# Patient Record
Sex: Male | Born: 1990 | Race: White | Hispanic: No | Marital: Single | Smoking: Current every day smoker
Health system: Southern US, Community
[De-identification: ages and names within clinical notes are randomized; demographics above are authoritative.]

## PROBLEM LIST (undated history)

## (undated) DIAGNOSIS — E7800 Pure hypercholesterolemia, unspecified: Secondary | ICD-10-CM

## (undated) DIAGNOSIS — R040 Epistaxis: Secondary | ICD-10-CM

## (undated) DIAGNOSIS — K219 Gastro-esophageal reflux disease without esophagitis: Secondary | ICD-10-CM

## (undated) HISTORY — DX: Pure hypercholesterolemia, unspecified: E78.00

## (undated) HISTORY — DX: Gastro-esophageal reflux disease without esophagitis: K21.9

---
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
## 2014-03-04 ENCOUNTER — Encounter (HOSPITAL_COMMUNITY): Payer: Self-pay | Admitting: Emergency Medicine

## 2014-03-04 ENCOUNTER — Emergency Department (HOSPITAL_COMMUNITY): Payer: Self-pay

## 2014-03-04 ENCOUNTER — Emergency Department (HOSPITAL_COMMUNITY)
Admission: EM | Admit: 2014-03-04 | Discharge: 2014-03-04 | Disposition: A | Discharge status: Home / Self Care | Payer: Self-pay | Attending: Emergency Medicine | Admitting: Emergency Medicine

## 2014-03-04 DIAGNOSIS — Y9289 Other specified places as the place of occurrence of the external cause: Secondary | ICD-10-CM | POA: Insufficient documentation

## 2014-03-04 DIAGNOSIS — S199XXA Unspecified injury of neck, initial encounter: Secondary | ICD-10-CM

## 2014-03-04 DIAGNOSIS — Y9389 Activity, other specified: Secondary | ICD-10-CM | POA: Insufficient documentation

## 2014-03-04 DIAGNOSIS — R040 Epistaxis: Secondary | ICD-10-CM | POA: Insufficient documentation

## 2014-03-04 DIAGNOSIS — S1093XA Contusion of unspecified part of neck, initial encounter: Principal | ICD-10-CM

## 2014-03-04 DIAGNOSIS — F17200 Nicotine dependence, unspecified, uncomplicated: Secondary | ICD-10-CM | POA: Insufficient documentation

## 2014-03-04 DIAGNOSIS — W108XXA Fall (on) (from) other stairs and steps, initial encounter: Secondary | ICD-10-CM | POA: Insufficient documentation

## 2014-03-04 DIAGNOSIS — S0993XA Unspecified injury of face, initial encounter: Secondary | ICD-10-CM | POA: Insufficient documentation

## 2014-03-04 DIAGNOSIS — S0003XA Contusion of scalp, initial encounter: Secondary | ICD-10-CM | POA: Insufficient documentation

## 2014-03-04 DIAGNOSIS — S0033XA Contusion of nose, initial encounter: Secondary | ICD-10-CM

## 2014-03-04 DIAGNOSIS — S0083XA Contusion of other part of head, initial encounter: Principal | ICD-10-CM | POA: Insufficient documentation

## 2014-03-04 NOTE — ED Notes (Signed)
Presents post fall on Saturday, fell down a flight of stairs after tripping, denies LOC, injured nose and had bleeding. Today began having bleeding from left nostril began one hour ago, controlled at this time. Bruising noted to bridge of nose. Pt denies neck pain. C/o dizziness. Denies nausea. Reports constant headache.

## 2014-03-04 NOTE — ED Provider Notes (Signed)
CSN: [id number]     Arrival date & time 03/04/14  0230 History   First MD Initiated Contact with Patient 03/04/14 0541     Chief Complaint  Patient presents with  . Fall     (Consider location/radiation/quality/duration/timing/severity/associated sxs/prior Treatment) Patient is a 23 y.o. male presenting with fall. The history is provided by the patient.  Fall  He fell down some stairs 2 days ago. He did not have loss of consciousness but did hit his face. He will actually been doing reasonably well until last night when he had 2 episodes of nosebleeds. Bleeding has stopped her bleeding has been from the left nostril. He has developed a headache which he did not have initially. He denies nausea or vomiting. He denies weakness or incoordination.  History reviewed. No pertinent past medical history. History reviewed. No pertinent past surgical history. History reviewed. No pertinent family history. History  Substance Use Topics  . Smoking status: Current Every Day Smoker    Types: Cigarettes  . Smokeless tobacco: Not on file  . Alcohol Use: Yes    Review of Systems  All other systems reviewed and are negative.     Allergies  Review of patient's allergies indicates no known allergies.  Home Medications   Prior to Admission medications   Not on File   BP 145/89  Pulse 70  Temp(Src) 98.3 F (36.8 C) (Oral)  Resp 20  Ht 6' (1.829 m)  Wt 207 lb (93.895 kg)  BMI 28.07 kg/m2  SpO2 97% Physical Exam  Nursing note and vitals reviewed.  23 year old male, resting comfortably and in no acute distress. Vital signs are significant for borderline hypertension her blood pressure 145/89. Oxygen saturation is 97%, which is normal. Head is normocephalic. PERRLA, EOMI. Oropharynx is clear. there is moderate swelling of the bridge of the nose with tenderness but no crepitus. Examination the nasal cavity shows no evidence of recent bleeding. Septum is in the midline without hematoma.  Periorbital ecchymoses are present which are more prominent on the right. Neck is nontender and supple without adenopathy or JVD. Back is nontender and there is no CVA tenderness. Lungs are clear without rales, wheezes, or rhonchi. Chest is nontender. Heart has regular rate and rhythm without murmur. Abdomen is soft, flat, nontender without masses or hepatosplenomegaly and peristalsis is normoactive. Extremities have no cyanosis or edema, full range of motion is present. Skin is warm and dry without rash. Neurologic: Mental status is normal, cranial nerves are intact, there are no motor or sensory deficits.  ED Course  Procedures (including critical care time) Imaging Review Dg Facial Bones Complete  03/04/2014   CLINICAL DATA:  Fall  EXAM: FACIAL BONES COMPLETE 3+V  COMPARISON:  None.  FINDINGS: There is no evidence of fracture or other significant bone abnormality. No orbital emphysema or sinus air-fluid levels are seen.  IMPRESSION: Negative.   Electronically Signed   By: Rise Mu M.D.   On: 03/04/2014 06:25   Dg Nasal Bones  03/04/2014   CLINICAL DATA:  Fall with nasal bridge pain.  EXAM: NASAL BONES - 3+ VIEW  COMPARISON:  Face CT 08/18/2007.  FINDINGS: Transverse lucencies through the nasal arch are chronic when correlated with previous imaging. No acute fracture identified. No gross opacification of the paranasal sinuses. The nasal septum appears midline.  IMPRESSION: Remote nasal arch fractures.  No acute fracture identified.   Electronically Signed   By: Tiburcio Pea M.D.   On: 03/04/2014 06:28  MDM   Final diagnoses:  Fall down stairs, initial encounter  Contusion, nose, initial encounter  Left-sided epistaxis    Fall with facial injury. I've advised him that CT would be a preferred test to evaluate his facial injuries but he states that he does not wish to have a CT in that he is worried about cost. I explained to him that plain x-rays can give some  information but are not nearly as good as CT but he wishes to go that route to try to save some money understanding that diagnostic accuracy is circumcised there is no indication for any emergent treatment of his nosebleed since there is no active bleeding and no obvious site of his bleeding. He is in for her x-rays of facial bones and nasal bones.  X-rays are negative for fractures. Patient is reassured on these findings and he is discharged with instructions on how to manage any future nosebleeds. He is given a work release for 24 hours.  Dione Boozeavid Deaveon Schoen, MD 03/04/14 [phone]

## 2014-03-04 NOTE — Discharge Instructions (Signed)
Contusion °A contusion is a deep bruise. Contusions are the result of an injury that caused bleeding under the skin. The contusion may turn blue, purple, or yellow. Minor injuries will give you a painless contusion, but more severe contusions may stay painful and swollen for a few weeks.  °CAUSES  °A contusion is usually caused by a blow, trauma, or direct force to an area of the body. °SYMPTOMS  °· Swelling and redness of the injured area. °· Bruising of the injured area. °· Tenderness and soreness of the injured area. °· Pain. °DIAGNOSIS  °The diagnosis can be made by taking a history and physical exam. An X-ray, CT scan, or MRI may be needed to determine if there were any associated injuries, such as fractures. °TREATMENT  °Specific treatment will depend on what area of the body was injured. In general, the best treatment for a contusion is resting, icing, elevating, and applying cold compresses to the injured area. Over-the-counter medicines may also be recommended for pain control. Ask your caregiver what the best treatment is for your contusion. °HOME CARE INSTRUCTIONS  °· Put ice on the injured area. °· Put ice in a plastic bag. °· Place a towel between your skin and the bag. °· Leave the ice on for 15-20 minutes, 3-4 times a day, or as directed by your health care provider. °· Only take over-the-counter or prescription medicines for pain, discomfort, or fever as directed by your caregiver. Your caregiver may recommend avoiding anti-inflammatory medicines (aspirin, ibuprofen, and naproxen) for 48 hours because these medicines may increase bruising. °· Rest the injured area. °· If possible, elevate the injured area to reduce swelling. °SEEK IMMEDIATE MEDICAL CARE IF:  °· You have increased bruising or swelling. °· You have pain that is getting worse. °· Your swelling or pain is not relieved with medicines. °MAKE SURE YOU:  °· Understand these instructions. °· Will watch your condition. °· Will get help right  away if you are not doing well or get worse. °Document Released: 04/27/2005 Document Revised: 07/23/2013 Document Reviewed: 05/23/2011 °ExitCare® Patient Information ©2015 [employer]. This information is not intended to replace advice given to you by your health care provider. Make sure you discuss any questions you have with your health care provider. °Nosebleed °Nosebleeds can be caused by many conditions, including trauma, infections, polyps, foreign bodies, dry mucous membranes or climate, medicines, and air conditioning. Most nosebleeds occur in the front of the nose. Because of this location, most nosebleeds can be controlled by pinching the nostrils gently and continuously for at least 10 to 20 minutes. The long, continuous pressure allows enough time for the blood to clot. If pressure is released during that 10 to 20 minute time period, the process may have to be started again. The nosebleed may stop by itself or quit with pressure, or it may need concentrated heating (cautery) or pressure from packing. °HOME CARE INSTRUCTIONS  °· If your nose was packed, try to maintain the pack inside until your health care provider removes it. If a gauze pack was used and it starts to fall out, gently replace it or cut the end off. Do not cut if a balloon catheter was used to pack the nose. Otherwise, do not remove unless instructed. °· Avoid blowing your nose for 12 hours after treatment. This could dislodge the pack or clot and start the bleeding again. °· If the bleeding starts again, sit up and bend forward, gently pinching the front half of your nose continuously   for 20 minutes. °· If bleeding was caused by dry mucous membranes, use over-the-counter saline nasal spray or gel. This will keep the mucous membranes moist and allow them to heal. If you must use a lubricant, choose the water-soluble variety. Use it only sparingly and not within several hours of lying down. °· Do not use petroleum jelly or mineral oil,  as these may drip into the lungs and cause serious problems. °· Maintain humidity in your home by using less air conditioning or by using a humidifier. °· Do not use aspirin or medicines which make bleeding more likely. Your health care provider can give you recommendations on this. °· Resume normal activities as you are able, but try to avoid straining, lifting, or bending at the waist for several days. °· If the nosebleeds become recurrent and the cause is unknown, your health care provider may suggest laboratory tests. °SEEK MEDICAL CARE IF: °You have a fever. °SEEK IMMEDIATE MEDICAL CARE IF:  °· Bleeding recurs and cannot be controlled. °· There is unusual bleeding from or bruising on other parts of the body. °· Nosebleeds continue. °· There is any worsening of the condition which originally brought you in. °· You become light-headed, feel faint, become sweaty, or vomit blood. °MAKE SURE YOU:  °· Understand these instructions. °· Will watch your condition. °· Will get help right away if you are not doing well or get worse. °Document Released: 04/27/2005 Document Revised: 12/02/2013 Document Reviewed: 06/18/2009 °ExitCare® Patient Information ©2015 [employer]. This information is not intended to replace advice given to you by your health care provider. Make sure you discuss any questions you have with your health care provider. ° °

## 2014-03-04 NOTE — ED Notes (Signed)
Patient transported to X-ray 

## 2014-03-14 ENCOUNTER — Emergency Department (HOSPITAL_COMMUNITY): Payer: Self-pay | Admitting: Certified Registered Nurse Anesthetist

## 2014-03-14 ENCOUNTER — Inpatient Hospital Stay: Admit: 2014-03-14 | Payer: Self-pay | Admitting: Otolaryngology

## 2014-03-14 ENCOUNTER — Emergency Department (HOSPITAL_COMMUNITY)
Admission: EM | Admit: 2014-03-14 | Discharge: 2014-03-14 | Disposition: A | Discharge status: Home / Self Care | Payer: Self-pay | Attending: Emergency Medicine | Admitting: Emergency Medicine

## 2014-03-14 ENCOUNTER — Encounter (HOSPITAL_COMMUNITY): Payer: Self-pay | Admitting: Emergency Medicine

## 2014-03-14 ENCOUNTER — Encounter (HOSPITAL_COMMUNITY): Payer: Self-pay | Admitting: Certified Registered Nurse Anesthetist

## 2014-03-14 ENCOUNTER — Encounter (HOSPITAL_COMMUNITY): Admission: EM | Disposition: A | Discharge status: Home / Self Care | Payer: Self-pay | Source: Home / Self Care

## 2014-03-14 DIAGNOSIS — F17200 Nicotine dependence, unspecified, uncomplicated: Secondary | ICD-10-CM | POA: Insufficient documentation

## 2014-03-14 DIAGNOSIS — R040 Epistaxis: Secondary | ICD-10-CM | POA: Insufficient documentation

## 2014-03-14 HISTORY — DX: Epistaxis: R04.0

## 2014-03-14 SURGERY — SEPTOPLASTY, NOSE
Anesthesia: General

## 2014-03-14 MED ORDER — ROCURONIUM BROMIDE 50 MG/5ML IV SOLN
INTRAVENOUS | Status: AC
  Filled 2014-03-14: qty 1

## 2014-03-14 MED ORDER — SUCCINYLCHOLINE CHLORIDE 20 MG/ML IJ SOLN
INTRAMUSCULAR | Status: AC
  Filled 2014-03-14: qty 1

## 2014-03-14 MED ORDER — OXYMETAZOLINE HCL 0.05 % NA SOLN
NASAL | Status: AC
  Filled 2014-03-14: qty 15

## 2014-03-14 MED ORDER — FENTANYL CITRATE 0.05 MG/ML IJ SOLN
INTRAMUSCULAR | Status: AC
  Filled 2014-03-14: qty 5

## 2014-03-14 MED ORDER — ONDANSETRON HCL 4 MG/2ML IJ SOLN
INTRAMUSCULAR | Status: AC
  Filled 2014-03-14: qty 2

## 2014-03-14 MED ORDER — OXYMETAZOLINE HCL 0.05 % NA SOLN
1.0000 | Freq: Once | NASAL | Status: AC
  Administered 2014-03-14: 1 via NASAL
  Filled 2014-03-14: qty 15

## 2014-03-14 MED ORDER — PROPOFOL 10 MG/ML IV BOLUS
INTRAVENOUS | Status: AC
  Filled 2014-03-14: qty 20

## 2014-03-14 MED ORDER — STERILE WATER FOR INJECTION IJ SOLN
INTRAMUSCULAR | Status: AC
  Filled 2014-03-14: qty 10

## 2014-03-14 MED ORDER — FENTANYL CITRATE 0.05 MG/ML IJ SOLN
50.0000 ug | Freq: Once | INTRAMUSCULAR | Status: AC
  Administered 2014-03-14: 50 ug via INTRAVENOUS
  Filled 2014-03-14: qty 2

## 2014-03-14 MED ORDER — MIDAZOLAM HCL 2 MG/2ML IJ SOLN
INTRAMUSCULAR | Status: AC
  Filled 2014-03-14: qty 2

## 2014-03-14 MED ORDER — LIDOCAINE HCL (CARDIAC) 20 MG/ML IV SOLN
INTRAVENOUS | Status: AC
  Filled 2014-03-14: qty 5

## 2014-03-14 MED ORDER — EPHEDRINE SULFATE 50 MG/ML IJ SOLN
INTRAMUSCULAR | Status: AC
  Filled 2014-03-14: qty 1

## 2014-03-14 SURGICAL SUPPLY — 45 items
ATTRACTOMAT 16X20 MAGNETIC DRP (DRAPES) IMPLANT
BLADE RAD40 ROTATE 4M 4 5PK (BLADE) IMPLANT
BLADE RAD40 ROTATE 4M 4MM 5PK (BLADE)
BLADE RAD60 ROTATE M4 4 5PK (BLADE) IMPLANT
BLADE RAD60 ROTATE M4 4MM 5PK (BLADE)
BLADE TRICUT ROTATE M4 4 5PK (BLADE) IMPLANT
BLADE TRICUT ROTATE M4 4MM 5PK (BLADE)
CANISTER SUCTION 2500CC (MISCELLANEOUS) ×4 IMPLANT
COAGULATOR SUCT 6 FR SWTCH (ELECTROSURGICAL)
COAGULATOR SUCT 8FR VV (MISCELLANEOUS) ×4 IMPLANT
COAGULATOR SUCT SWTCH 10FR 6 (ELECTROSURGICAL) IMPLANT
CRADLE DONUT ADULT HEAD (MISCELLANEOUS) ×4 IMPLANT
DRESSING NASAL KENNEDY 3.5X.9 (MISCELLANEOUS) IMPLANT
DRESSING TELFA 8X3 (GAUZE/BANDAGES/DRESSINGS) ×4 IMPLANT
DRSG NASAL KENNEDY 3.5X.9 (MISCELLANEOUS)
DRSG NASOPORE 8CM (GAUZE/BANDAGES/DRESSINGS) IMPLANT
ELECT COATED BLADE 2.86 ST (ELECTRODE) ×4 IMPLANT
ELECT REM PT RETURN 9FT ADLT (ELECTROSURGICAL) ×3
ELECTRODE REM PT RTRN 9FT ADLT (ELECTROSURGICAL) ×2 IMPLANT
FILTER ARTHROSCOPY CONVERTOR (FILTER) IMPLANT
GAUZE SPONGE 2X2 8PLY STRL LF (GAUZE/BANDAGES/DRESSINGS) ×2 IMPLANT
GLOVE ECLIPSE 7.5 STRL STRAW (GLOVE) ×8 IMPLANT
GOWN STRL REUS W/ TWL LRG LVL3 (GOWN DISPOSABLE) ×4 IMPLANT
GOWN STRL REUS W/TWL LRG LVL3 (GOWN DISPOSABLE) ×6
KIT BASIN OR (CUSTOM PROCEDURE TRAY) ×4 IMPLANT
KIT ROOM TURNOVER OR (KITS) ×4 IMPLANT
NEEDLE 27GAX1X1/2 (NEEDLE) ×4 IMPLANT
NS IRRIG 1000ML POUR BTL (IV SOLUTION) ×4 IMPLANT
PAD ARMBOARD 7.5X6 YLW CONV (MISCELLANEOUS) ×8 IMPLANT
PATTIES SURGICAL .5 X3 (DISPOSABLE) ×8 IMPLANT
PENCIL FOOT CONTROL (ELECTRODE) ×4 IMPLANT
SHEATH ENDOSCRUB 0 DEG (SHEATH) IMPLANT
SHEATH ENDOSCRUB 30 DEG (SHEATH) IMPLANT
SPECIMEN JAR SMALL (MISCELLANEOUS) ×4 IMPLANT
SPONGE GAUZE 2X2 STER 10/PKG (GAUZE/BANDAGES/DRESSINGS) ×2
SUT ETHILON 3 0 PS 1 (SUTURE) IMPLANT
SUT PLAIN 4 0 ~~LOC~~ 1 (SUTURE) ×4 IMPLANT
SWAB COLLECTION DEVICE MRSA (MISCELLANEOUS) IMPLANT
SYR 50ML SLIP (SYRINGE) IMPLANT
TOWEL OR 17X24 6PK STRL BLUE (TOWEL DISPOSABLE) ×4 IMPLANT
TOWEL OR 17X26 10 PK STRL BLUE (TOWEL DISPOSABLE) ×4 IMPLANT
TRAY ENT MC OR (CUSTOM PROCEDURE TRAY) ×4 IMPLANT
TUBE ANAEROBIC SPECIMEN COL (MISCELLANEOUS) IMPLANT
TUBING EXTENTION W/L.L. (IV SETS) ×4 IMPLANT
WATER STERILE IRR 1000ML POUR (IV SOLUTION) ×4 IMPLANT

## 2014-03-14 NOTE — ED Notes (Signed)
Pt reports bleeding has ceased for approx 30min - Dr. Rhunette CroftNanavati made aware.

## 2014-03-14 NOTE — ED Notes (Signed)
Per Dr. Lucky Rathkeosen's discussion with pt, the pt is to be discharged & f/u on Weds., per pt request the pts girlfriend was contacted & a message was left updating her on the pts plan of care

## 2014-03-14 NOTE — ED Notes (Signed)
Pt has been NPO since last night  

## 2014-03-14 NOTE — Anesthesia Preprocedure Evaluation (Deleted)
Anesthesia Evaluation  Patient identified by MRN, date of birth, ID band Patient awake    Reviewed: Allergy & Precautions, H&P , NPO status , Patient's Chart, lab work & pertinent test results  Airway       Dental  (+) Dental Advisory Given   Pulmonary Current Smoker,          Cardiovascular     Neuro/Psych    GI/Hepatic   Endo/Other    Renal/GU      Musculoskeletal   Abdominal   Peds  Hematology   Anesthesia Other Findings   Reproductive/Obstetrics                          Anesthesia Physical Anesthesia Plan  ASA: II  Anesthesia Plan: General   Post-op Pain Management:    Induction: Intravenous  Airway Management Planned: Oral ETT  Additional Equipment:   Intra-op Plan:   Post-operative Plan: Extubation in OR  Informed Consent: I have reviewed the patients History and Physical, chart, labs and discussed the procedure including the risks, benefits and alternatives for the proposed anesthesia with the patient or authorized representative who has indicated his/her understanding and acceptance.   Dental advisory given  Plan Discussed with: CRNA, Anesthesiologist and Surgeon  Anesthesia Plan Comments:         Anesthesia Quick Evaluation

## 2014-03-14 NOTE — ED Notes (Signed)
Spoke with Pam in OR re: the plan of care, pt to remain in the ER to be evaluated by Odis LusterBowers, MD

## 2014-03-14 NOTE — ED Notes (Signed)
Pt reports falling on his apartment stairs x2 weeks ago and injuring his nose - pt states he had xrays done that showed no signs of fracture - pt has been experiencing intermittent nose bleeds x2 weeks after laughing or sneezing - [facility] - pt reports bleeding is now occurring around nasal packing and draining down the back of his throw as well.

## 2014-03-14 NOTE — ED Notes (Signed)
Pt sneezed x3 episodes - epistaxis reoccurred. EDP made aware.

## 2014-03-14 NOTE — ED Provider Notes (Signed)
MSE was initiated and I personally evaluated the patient and placed orders (if any) at 2:30 pm on March 14, 2014.  Patient presents to the ER while he is waiting to go to the OR with Dr. Pollyann Kennedyosen. Apparently came from urgent care due to recurrent nosebleeds. Dr. Pollyann Kennedyosen packed his left nostril he still having mild oozing is noted on exam today. He's not having any dyspnea or lightheadedness. Patient's vital signs are normal. He is currently n.p.o. and is waiting for OR to be open.   Audree CamelScott T Amea Mcphail, MD 03/14/14 [phone]

## 2014-03-14 NOTE — Discharge Instructions (Signed)
Please see Dr. Pollyann Kennedyosen at 9 am.   Nosebleed Nosebleeds can be caused by many conditions, including trauma, infections, polyps, foreign bodies, dry mucous membranes or climate, medicines, and air conditioning. Most nosebleeds occur in the front of the nose. Because of this location, most nosebleeds can be controlled by pinching the nostrils gently and continuously for at least 10 to 20 minutes. The long, continuous pressure allows enough time for the blood to clot. If pressure is released during that 10 to 20 minute time period, the process may have to be started again. The nosebleed may stop by itself or quit with pressure, or it may need concentrated heating (cautery) or pressure from packing. HOME CARE INSTRUCTIONS   If your nose was packed, try to maintain the pack inside until your health care provider removes it. If a gauze pack was used and it starts to fall out, gently replace it or cut the end off. Do not cut if a balloon catheter was used to pack the nose. Otherwise, do not remove unless instructed.  Avoid blowing your nose for 12 hours after treatment. This could dislodge the pack or clot and start the bleeding again.  If the bleeding starts again, sit up and bend forward, gently pinching the front half of your nose continuously for 20 minutes.  If bleeding was caused by dry mucous membranes, use over-the-counter saline nasal spray or gel. This will keep the mucous membranes moist and allow them to heal. If you must use a lubricant, choose the water-soluble variety. Use it only sparingly and not within several hours of lying down.  Do not use petroleum jelly or mineral oil, as these may drip into the lungs and cause serious problems.  Maintain humidity in your home by using less air conditioning or by using a humidifier.  Do not use aspirin or medicines which make bleeding more likely. Your health care provider can give you recommendations on this.  Resume normal activities as you are  able, but try to avoid straining, lifting, or bending at the waist for several days.  If the nosebleeds become recurrent and the cause is unknown, your health care provider may suggest laboratory tests. SEEK MEDICAL CARE IF: You have a fever. SEEK IMMEDIATE MEDICAL CARE IF:   Bleeding recurs and cannot be controlled.  There is unusual bleeding from or bruising on other parts of the body.  Nosebleeds continue.  There is any worsening of the condition which originally brought you in.  You become light-headed, feel faint, become sweaty, or vomit blood. MAKE SURE YOU:   Understand these instructions.  Will watch your condition.  Will get help right away if you are not doing well or get worse. Document Released: 04/27/2005 Document Revised: 12/02/2013 Document Reviewed: 06/18/2009 [facility] Patient Information 2015 [place], [place]. This information is not intended to replace advice given to you by your health care provider. Make sure you discuss any questions you have with your health care provider.

## 2014-03-14 NOTE — H&P (Signed)
Zachary Callahan is an 23 y.o. male.   Chief Complaint: Intractable epistaxis HPI: Two-week history recurrent left anterior epistaxis. Transferred to my office this morning from the emergency department. Left nasal endoscopy revealed septal deviation, unable to visualize the source of bleeding. Packing placed. Started bleeding shortly thereafter.  Past Medical History  Diagnosis Date  . Bleeding from the nose     No past surgical history on file.  No family history on file. Social History:  reports that he has been smoking Cigarettes.  He has been smoking about 0.00 packs per day. He does not have any smokeless tobacco history on file. He reports that he drinks alcohol. He reports that he does not use illicit drugs.  Allergies: No Known Allergies   (Not in a hospital admission)  No results found for this or any previous visit (from the past 48 hour(s)). No results found.  ROS: otherwise negative  There were no vitals taken for this visit.  PHYSICAL EXAM: Overall appearance:  Healthy appearing, in no distress Head:  Normocephalic, atraumatic. Ears: External auditory canals are clear; tympanic membranes are intact and the middle ears are free of any effusion. Nose: External nose is healthy in appearance. Internal nasal exam free of any lesions or obstruction on the right, packing in place on the left. Oral Cavity/pharynx:  There are no mucosal lesions or masses identified. Neuro:  No identifiable neurologic deficits. Neck: No palpable neck masses.  Studies Reviewed: none    Assessment/Plan Nasal septal deviation, intractable epistaxis. Recommend urgent nasal septoplasty with treatment of epistaxis.  Daisy Mcneel 03/14/2014, 12:34 PM

## 2014-03-14 NOTE — ED Notes (Signed)
Presents with left nare nosebleed-packed today at [facility]-at 11 pm began bleeding again-at this time bleeding is slowed. Nosebleed has been off and on for one week. Denies dizziness and weakness.

## 2014-03-14 NOTE — ED Notes (Signed)
Pt given gown and instructed to take off all clothing and get into gown. Pt agreed.

## 2014-03-14 NOTE — ED Notes (Signed)
Pt sent from doctor, told to come to ED to check in and then is supposed to have sx performed by Dr. Pollyann Kennedyosen. Pt has hx of nose bleeds, was seen yesterday for the same. Nad, skin warm and dry, resp e/u.

## 2014-03-14 NOTE — ED Notes (Signed)
Spoke with Dr. Pollyann Kennedyosen and OR. They are ready for the pt in the OR. Call [phone] once pt is undressed and into a hospital gown.

## 2014-03-14 NOTE — ED Notes (Signed)
Pt spoke with girlfriend on phone, pt to be picked up in lobby by girlfriend

## 2014-03-14 NOTE — Discharge Instructions (Signed)

## 2014-03-14 NOTE — ED Notes (Signed)
Pt ambulating independently w/ steady gait on d/c in no acute distress, A&Ox4.  

## 2014-03-15 ENCOUNTER — Encounter (HOSPITAL_COMMUNITY): Payer: Self-pay | Admitting: Emergency Medicine

## 2014-03-15 ENCOUNTER — Emergency Department (HOSPITAL_COMMUNITY): Payer: Self-pay | Admitting: Anesthesiology

## 2014-03-15 ENCOUNTER — Encounter (HOSPITAL_COMMUNITY)
Admission: EM | Disposition: A | Discharge status: Home / Self Care | Payer: Self-pay | Source: Home / Self Care | Attending: Emergency Medicine

## 2014-03-15 ENCOUNTER — Ambulatory Visit (HOSPITAL_COMMUNITY)
Admission: EM | Admit: 2014-03-15 | Discharge: 2014-03-15 | Disposition: A | Discharge status: Home / Self Care | Payer: Self-pay | Attending: Emergency Medicine | Admitting: Emergency Medicine

## 2014-03-15 ENCOUNTER — Encounter (HOSPITAL_COMMUNITY): Payer: Self-pay | Admitting: Anesthesiology

## 2014-03-15 DIAGNOSIS — J342 Deviated nasal septum: Secondary | ICD-10-CM | POA: Insufficient documentation

## 2014-03-15 DIAGNOSIS — R040 Epistaxis: Secondary | ICD-10-CM | POA: Insufficient documentation

## 2014-03-15 HISTORY — PX: NASAL ENDOSCOPY WITH EPISTAXIS CONTROL: SHX5664

## 2014-03-15 LAB — CBC
HCT: 33.9 % — ABNORMAL LOW (ref 39.0–52.0)
Hemoglobin: 11.7 g/dL — ABNORMAL LOW (ref 13.0–17.0)
MCH: 31 pg (ref 26.0–34.0)
MCHC: 34.5 g/dL (ref 30.0–36.0)
MCV: 89.9 fL (ref 78.0–100.0)
PLATELETS: 241 10*3/uL (ref 150–400)
RBC: 3.77 MIL/uL — AB (ref 4.22–5.81)
RDW: 13.2 % (ref 11.5–15.5)
WBC: 8.7 10*3/uL (ref 4.0–10.5)

## 2014-03-15 LAB — POCT I-STAT 4, (NA,K, GLUC, HGB,HCT)
Glucose, Bld: 102 mg/dL — ABNORMAL HIGH (ref 70–99)
HCT: 36 % — ABNORMAL LOW (ref 39.0–52.0)
HEMOGLOBIN: 12.2 g/dL — AB (ref 13.0–17.0)
Potassium: 4 mEq/L (ref 3.7–5.3)
Sodium: 139 mEq/L (ref 137–147)

## 2014-03-15 LAB — PROTIME-INR
INR: 1.11 (ref 0.00–1.49)
PROTHROMBIN TIME: 14.3 s (ref 11.6–15.2)

## 2014-03-15 SURGERY — CONTROL OF EPISTAXIS, ENDOSCOPIC
Anesthesia: General | Site: Nose | Laterality: Left

## 2014-03-15 MED ORDER — HYDROCODONE-ACETAMINOPHEN 7.5-325 MG/15ML PO SOLN: 15.0000 mL | Freq: Four times a day (QID) | ORAL | Status: AC | PRN

## 2014-03-15 MED ORDER — ONDANSETRON HCL 4 MG/2ML IJ SOLN
INTRAMUSCULAR | Status: DC | PRN
  Administered 2014-03-15: 4 mg via INTRAVENOUS

## 2014-03-15 MED ORDER — OXYMETAZOLINE HCL 0.05 % NA SOLN
NASAL | Status: DC | PRN
  Administered 2014-03-15: 1 via NASAL

## 2014-03-15 MED ORDER — BACITRACIN ZINC 500 UNIT/GM EX OINT
TOPICAL_OINTMENT | CUTANEOUS | Status: AC
  Filled 2014-03-15: qty 15

## 2014-03-15 MED ORDER — OXYCODONE HCL 5 MG PO TABS: 5.0000 mg | ORAL_TABLET | Freq: Once | ORAL | Status: AC | PRN

## 2014-03-15 MED ORDER — BACITRACIN ZINC 500 UNIT/GM EX OINT
TOPICAL_OINTMENT | CUTANEOUS | Status: DC | PRN
  Administered 2014-03-15: 1 via TOPICAL

## 2014-03-15 MED ORDER — MIDAZOLAM HCL 2 MG/2ML IJ SOLN
INTRAMUSCULAR | Status: AC
  Filled 2014-03-15: qty 2

## 2014-03-15 MED ORDER — LIDOCAINE-EPINEPHRINE 1 %-1:100000 IJ SOLN
INTRAMUSCULAR | Status: DC | PRN
  Administered 2014-03-15: 6 mL

## 2014-03-15 MED ORDER — HEMOSTATIC AGENTS (NO CHARGE) OPTIME
TOPICAL | Status: DC | PRN
  Administered 2014-03-15: 1 via TOPICAL

## 2014-03-15 MED ORDER — FENTANYL CITRATE 0.05 MG/ML IJ SOLN
INTRAMUSCULAR | Status: AC
  Filled 2014-03-15: qty 5

## 2014-03-15 MED ORDER — PROPOFOL 10 MG/ML IV BOLUS
INTRAVENOUS | Status: DC | PRN
  Administered 2014-03-15: 200 mg via INTRAVENOUS

## 2014-03-15 MED ORDER — OXYMETAZOLINE HCL 0.05 % NA SOLN
NASAL | Status: AC
  Filled 2014-03-15: qty 15

## 2014-03-15 MED ORDER — CEPHALEXIN 500 MG PO CAPS: 500.0000 mg | ORAL_CAPSULE | Freq: Three times a day (TID) | ORAL | Status: DC

## 2014-03-15 MED ORDER — SUCCINYLCHOLINE CHLORIDE 20 MG/ML IJ SOLN
INTRAMUSCULAR | Status: DC | PRN
  Administered 2014-03-15: 120 mg via INTRAVENOUS

## 2014-03-15 MED ORDER — LIDOCAINE-EPINEPHRINE 1 %-1:100000 IJ SOLN
INTRAMUSCULAR | Status: AC
  Filled 2014-03-15: qty 1

## 2014-03-15 MED ORDER — HYDROMORPHONE HCL PF 1 MG/ML IJ SOLN: 0.2500 mg | INTRAMUSCULAR | Status: DC | PRN

## 2014-03-15 MED ORDER — CEFAZOLIN SODIUM-DEXTROSE 2-3 GM-% IV SOLR
INTRAVENOUS | Status: DC | PRN
  Administered 2014-03-15: 2 g via INTRAVENOUS

## 2014-03-15 MED ORDER — LACTATED RINGERS IV SOLN
INTRAVENOUS | Status: DC | PRN
  Administered 2014-03-15 (×2): via INTRAVENOUS

## 2014-03-15 MED ORDER — OXYCODONE HCL 5 MG/5ML PO SOLN
5.0000 mg | Freq: Once | ORAL | Status: AC | PRN
  Administered 2014-03-15: 5 mg via ORAL

## 2014-03-15 MED ORDER — PROPOFOL 10 MG/ML IV BOLUS
INTRAVENOUS | Status: AC
  Filled 2014-03-15: qty 20

## 2014-03-15 MED ORDER — LIDOCAINE HCL (CARDIAC) 20 MG/ML IV SOLN
INTRAVENOUS | Status: DC | PRN
  Administered 2014-03-15: 40 mg via INTRAVENOUS

## 2014-03-15 MED ORDER — OXYCODONE HCL 5 MG/5ML PO SOLN
ORAL | Status: AC
  Filled 2014-03-15: qty 5

## 2014-03-15 MED ORDER — FENTANYL CITRATE 0.05 MG/ML IJ SOLN
INTRAMUSCULAR | Status: DC | PRN
  Administered 2014-03-15: 50 ug via INTRAVENOUS
  Administered 2014-03-15: 100 ug via INTRAVENOUS
  Administered 2014-03-15 (×5): 50 ug via INTRAVENOUS

## 2014-03-15 MED ORDER — MIDAZOLAM HCL 5 MG/5ML IJ SOLN
INTRAMUSCULAR | Status: DC | PRN
  Administered 2014-03-15: 2 mg via INTRAVENOUS

## 2014-03-15 MED ORDER — ONDANSETRON HCL 4 MG/2ML IJ SOLN: 4.0000 mg | Freq: Once | INTRAMUSCULAR | Status: DC | PRN

## 2014-03-15 SURGICAL SUPPLY — 27 items
BLADE 10 SAFETY STRL DISP (BLADE) ×3 IMPLANT
CANISTER SUCTION 1200CC (MISCELLANEOUS) ×3 IMPLANT
COAGULATOR SUCT 6 FR SWTCH (ELECTROSURGICAL) ×1
COAGULATOR SUCT SWTCH 10FR 6 (ELECTROSURGICAL) ×2 IMPLANT
COVER MAYO STAND STRL (DRAPES) ×3 IMPLANT
DECANTER SPIKE VIAL GLASS SM (MISCELLANEOUS) IMPLANT
DRAPE PROXIMA HALF (DRAPES) ×3 IMPLANT
ELECT REM PT RETURN 9FT ADLT (ELECTROSURGICAL) ×3
ELECT REM PT RETURN 9FT PED (ELECTROSURGICAL)
ELECTRODE REM PT RETRN 9FT PED (ELECTROSURGICAL) IMPLANT
ELECTRODE REM PT RTRN 9FT ADLT (ELECTROSURGICAL) IMPLANT
GAUZE PACKING FOLDED 1/2 STR (GAUZE/BANDAGES/DRESSINGS) ×2 IMPLANT
GAUZE SPONGE 2X2 8PLY STRL LF (GAUZE/BANDAGES/DRESSINGS) IMPLANT
GLOVE ECLIPSE 7.5 STRL STRAW (GLOVE) ×3 IMPLANT
GOWN STRL REUS W/ TWL XL LVL3 (GOWN DISPOSABLE) IMPLANT
GOWN STRL REUS W/TWL XL LVL3 (GOWN DISPOSABLE)
HEMOSTAT SURGICEL 2X14 (HEMOSTASIS) ×2 IMPLANT
KIT BASIN OR (CUSTOM PROCEDURE TRAY) IMPLANT
PATTIES SURGICAL .5 X3 (DISPOSABLE) ×2 IMPLANT
PATTIES SURGICAL 1X1 (DISPOSABLE) IMPLANT
SPONGE GAUZE 2X2 STER 10/PKG (GAUZE/BANDAGES/DRESSINGS)
SUT CHROMIC 4 0 P 3 18 (SUTURE) ×2 IMPLANT
SUT PLAIN 4 0 ~~LOC~~ 1 (SUTURE) ×2 IMPLANT
TOWEL OR 17X24 6PK STRL BLUE (TOWEL DISPOSABLE) ×3 IMPLANT
TUBE CONNECTING 12'X1/4 (SUCTIONS) ×1
TUBE CONNECTING 12X1/4 (SUCTIONS) ×2 IMPLANT
WATER STERILE IRR 1000ML POUR (IV SOLUTION) IMPLANT

## 2014-03-15 NOTE — Op Note (Signed)
Preop/postop diagnosis epistaxis Procedure: Left nasal endoscopy with cauterization and anterior/posterior packing Anesthesia: Gen. Estimated blood loss: Approximately 25 cc Indications: 23 year old who's had a two-week history of bleeding from the left side of his nose. He's had packing by the emergency room and then packed in the office by Dr. Pollyann Kennedyosen. He still had bleeding today bleeding through the packing he has. It still is only from the left side. He now is going to the operating room to try to control this. Possible septoplasty and nasal cautery and packing. He was informed risks and benefits of the procedure and options were discussed. All questions are answered and consent was obtained. Procedure: Patient was taken to the operating room placed in the supine position after adequate general anesthesia was placed in the supine position draped in the usual sterile manner. The right side of the septum was injected with 1% lidocaine with 1 100,000 epinephrine and then the packing was removed. There was bleeding from the area of his anterior superior septum. This was very irritated and looked like it didn't cauterize previously. This area was cauterized again with suction cautery and it did stop the bleeding in that location. The septum was deviated to the left a bit but it wasn't significant enough not to be able to see back into the back of the nose after the inferior turbinate was injected and outfracture. Everywhere and instrument touch the patient started bleeding. This included the inferior turbinate which was cauterized. There was some bleeding coming from the middle turbinate which was cauterized. An attempt was made to try to get better access by performing a septoplasty and an anterior hemitransfixion incision was performed. The septum flap seem to be adherent and extremely friable. This obviously was partly due to the previous cautery and the current cautery. The flap was beginning to break up and  this procedure was aborted. The septum was fractured inferiorly into the midline where the spur was located. The medial aspect of the middle turbinate a pledget was placed into that location. There was a small amount of bleeding coming from that area. There was no bleeding that appeared to be coming from the very posterior aspect of the nose. The pledget was removed and Surgicel was placed up into the superior aspect of the nose medial to the middle turbinate. There was several more spots in the septum and inferior turbinates were cauterized. The patient still had just a small ooze from many different spots so it was elected to packing with an anterior posterior gauze pack. This was placed layer by layer from posterior to anterior layering and up into the superior aspect of the nose. This was soaked with bacitracin. There did seem to be hemostasis. The hemitransfixion incision and septum was closed interrupted 4-0 chromic and a 40 quilting stitch placed to the septum prior to the packing being placed. He was then awakened brought to recovery room in stable condition counts correct.

## 2014-03-15 NOTE — ED Notes (Signed)
Per pt sts that PTA his nose started bleeding heavily. sts here yesterday and had nose packed.

## 2014-03-15 NOTE — Progress Notes (Signed)
INR/PT, CBC results called to Dr Jearld FentonByers. Pt informed to return to office Thur or Friday, call for appointment on Mon. Released to home.

## 2014-03-15 NOTE — ED Notes (Signed)
Transported to short stay.  

## 2014-03-15 NOTE — Transfer of Care (Signed)
Immediate Anesthesia Transfer of Care Note  Patient: Zachary Callahan  Procedure(s) Performed: Procedure(s): NASAL ENDOSCOPY WITH EPISTAXIS CONTROL (Left)  Patient Location: PACU  Anesthesia Type:General  Level of Consciousness: awake, alert  and oriented  Airway & Oxygen Therapy: Patient Spontanous Breathing  Post-op Assessment: Report given to PACU RN  Post vital signs: Reviewed and stable  Complications: No apparent anesthesia complications

## 2014-03-15 NOTE — H&P (Signed)
Zachary Callahan is an 23 y.o. male.   Chief Complaint: epistaxis HPI: Hx of 2 weeks of epistaxis. He has bleed again today nad now needs surgical correction  Past Medical History  Diagnosis Date  . Bleeding from the nose     History reviewed. No pertinent past surgical history.  History reviewed. No pertinent family history. Social History:  reports that he has been smoking Cigarettes.  He has been smoking about 0.00 packs per day. He does not have any smokeless tobacco history on file. He reports that he drinks alcohol. He reports that he does not use illicit drugs.  Allergies: No Known Allergies   (Not in a hospital admission)  No results found for this or any previous visit (from the past 48 hour(s)). No results found.  Review of Systems  Constitutional: Negative.   HENT: Positive for nosebleeds.   Eyes: Negative.   Respiratory: Negative.   Cardiovascular: Negative.   Gastrointestinal: Negative.   Musculoskeletal: Negative.   Skin: Negative.     Blood pressure 148/89, pulse 89, temperature 0 F (-17.8 C), resp. rate 18, SpO2 99.00%. Physical Exam  Constitutional: He appears well-developed and well-nourished.  HENT:  Head: Normocephalic.  Mouth/Throat: Oropharynx is clear and moist.  Packing in the left nose. No bleeding currently  Eyes: Conjunctivae are normal. Pupils are equal, round, and reactive to light.  Neck: Normal range of motion. Neck supple.  Cardiovascular: Normal rate.   Respiratory: Effort normal.  GI: Soft.  Musculoskeletal: Normal range of motion.     Assessment/Plan Epistaxis- he has a deviated septum and needs septoplasty and control of epistaxis. Discussed procedures and risk benefits and options discussed. All questions answered and consent obtained  Suzanna ObeyBYERS, Giovanni Bath 03/15/2014, 12:44 PM

## 2014-03-15 NOTE — Progress Notes (Signed)
Pt encouraged to follow up with BP checks with Family MD. Consistently averaging 160/100 during this and recent visits

## 2014-03-15 NOTE — Anesthesia Postprocedure Evaluation (Signed)
  Anesthesia Post-op Note  Patient: Zachary Callahan  Procedure(s) Performed: Procedure(s): NASAL ENDOSCOPY WITH EPISTAXIS CONTROL (Left)  Patient Location: PACU  Anesthesia Type:General  Level of Consciousness: awake, oriented and patient cooperative  Airway and Oxygen Therapy: Patient Spontanous Breathing  Post-op Pain: none  Post-op Assessment: Post-op Vital signs reviewed, Patient's Cardiovascular Status Stable, Respiratory Function Stable, Patent Airway and Pain level controlled  Post-op Vital Signs: stable  Last Vitals:  Filed Vitals:   03/15/14 1547  BP: 183/98  Pulse: 105  Temp: 36.4 C  Resp: 13    Complications: No apparent anesthesia complications

## 2014-03-15 NOTE — Anesthesia Preprocedure Evaluation (Signed)
Anesthesia Evaluation  Patient identified by MRN, date of birth, ID band Patient awake    Reviewed: Allergy & Precautions, H&P , NPO status , Patient's Chart, lab work & pertinent test results  Airway Mallampati: II TM Distance: >3 FB Neck ROM: Full    Dental  (+) Teeth Intact, Dental Advisory Given   Pulmonary Current Smoker,  breath sounds clear to auscultation        Cardiovascular Rhythm:Regular Rate:Normal     Neuro/Psych    GI/Hepatic   Endo/Other    Renal/GU      Musculoskeletal   Abdominal   Peds  Hematology   Anesthesia Other Findings   Reproductive/Obstetrics                           Anesthesia Physical Anesthesia Plan  ASA: II  Anesthesia Plan: General   Post-op Pain Management:    Induction: Intravenous  Airway Management Planned: Oral ETT  Additional Equipment:   Intra-op Plan:   Post-operative Plan: Extubation in OR  Informed Consent: I have reviewed the patients History and Physical, chart, labs and discussed the procedure including the risks, benefits and alternatives for the proposed anesthesia with the patient or authorized representative who has indicated his/her understanding and acceptance.   Dental advisory given  Plan Discussed with: CRNA and Anesthesiologist  Anesthesia Plan Comments: (Persistent epistaxis  Smoker  Plan GA with RSI  Kipp Broodavid Keir Viernes)        Anesthesia Quick Evaluation

## 2014-03-15 NOTE — ED Provider Notes (Signed)
CSN: [id number]     Arrival date & time 03/15/14  1121 History   First MD Initiated Contact with Patient 03/15/14 1137     Chief Complaint  Patient presents with  . Epistaxis     (Consider location/radiation/quality/duration/timing/severity/associated sxs/prior Treatment) HPI Comments: Patient here complaining of persistent left-sided epistaxis. Seen by ENT yesterday have no spasms with compared the bleeding should start again.. Bleeding began several hours prior to arrival with some drainage his posterior pharynx. He didn't help her depression with some relief. Patient states he has had intermittent epistaxis since an injury to his nose 2 weeks ago. Denies any bleeding from the right side. Denies any vomiting. Symptoms persistent and nothing makes them worse.  Patient is a 23 y.o. male presenting with nosebleeds. The history is provided by the patient.  Epistaxis   Past Medical History  Diagnosis Date  . Bleeding from the nose    History reviewed. No pertinent past surgical history. History reviewed. No pertinent family history. History  Substance Use Topics  . Smoking status: Current Every Day Smoker    Types: Cigarettes  . Smokeless tobacco: Not on file  . Alcohol Use: Yes    Review of Systems  HENT: Positive for nosebleeds.   All other systems reviewed and are negative.     Allergies  Review of patient's allergies indicates no known allergies.  Home Medications   Prior to Admission medications   Medication Sig Start Date End Date Taking? Authorizing Provider  acetaminophen (TYLENOL) 325 MG tablet Take 650 mg by mouth every 6 (six) hours as needed for headache.   Yes Historical Provider, MD   BP 167/109  Pulse 111  Resp 18  SpO2 99% Physical Exam  Nursing note and vitals reviewed. Constitutional: He is oriented to person, place, and time. He appears well-developed and well-nourished.  Non-toxic appearance. No distress.  HENT:  Head: Normocephalic and  atraumatic.  Nose:    Eyes: Conjunctivae, EOM and lids are normal. Pupils are equal, round, and reactive to light.  Neck: Normal range of motion. Neck supple. No tracheal deviation present. No mass present.  Cardiovascular: Normal rate, regular rhythm and normal heart sounds.  Exam reveals no gallop.   No murmur heard. Pulmonary/Chest: Effort normal and breath sounds normal. No stridor. No respiratory distress. He has no decreased breath sounds. He has no wheezes. He has no rhonchi. He has no rales.  Abdominal: Soft. Normal appearance and bowel sounds are normal. He exhibits no distension. There is no tenderness. There is no rebound and no CVA tenderness.  Musculoskeletal: Normal range of motion. He exhibits no edema and no tenderness.  Neurological: He is alert and oriented to person, place, and time. He has normal strength. No cranial nerve deficit or sensory deficit. GCS eye subscore is 4. GCS verbal subscore is 5. GCS motor subscore is 6.  Skin: Skin is warm and dry. No abrasion and no rash noted.  Psychiatric: He has a normal mood and affect. His speech is normal and behavior is normal.    ED Course  Procedures (including critical care time) Labs Review Labs Reviewed - No data to display  Imaging Review No results found.   EKG Interpretation None      MDM   Final diagnoses:  None    Spoke with Dr. Jearld FentonByers and patient be seen in the ED    Toy BakerAnthony T Tymesha Ditmore, MD 03/19/14 1006

## 2014-03-15 NOTE — ED Notes (Signed)
Dr. Frankey PootBeyer discussed septoplasty with patient. Consent signed.

## 2014-03-15 NOTE — ED Provider Notes (Signed)
CSN: [id number]     Arrival date & time 03/14/14  0010 History   First MD Initiated Contact with Patient 03/14/14 0200     Chief Complaint  Patient presents with  . Epistaxis     (Consider location/radiation/quality/duration/timing/severity/associated sxs/prior Treatment) HPI Comments: Pt comes in with cc of epistaxis. Pt has been having nose bleeds x 2 weeks. The bleed is from left nare. Pt went to the urgent care today, he had his nose packed, his bleeding ceased initially, but returned soon after, and has been worse than the bleeds he was accustomed to since then. States that the bleeding is constant too. Pt wants us to remove the packing, and doesn't want a new packing placed.   Patient is a 23 y.o. male presenting with nosebleeds. The history is provided by the patient.  Epistaxis Associated symptoms: no dizziness     Past Medical History  Diagnosis Date  . Bleeding from the nose    History reviewed. No pertinent past surgical history. History reviewed. No pertinent family history. History  Substance Use Topics  . Smoking status: Current Every Day Smoker    Types: Cigarettes  . Smokeless tobacco: Not on file  . Alcohol Use: Yes    Review of Systems  Constitutional: Negative for diaphoresis and fatigue.  HENT: Positive for nosebleeds.   Neurological: Negative for dizziness and light-headedness.  Hematological: Does not bruise/bleed easily.      Allergies  Review of patient's allergies indicates no known allergies.  Home Medications   Prior to Admission medications   Not on File   BP 130/83  Pulse 83  Temp(Src) 98 F (36.7 C) (Oral)  Resp 20  SpO2 100% Physical Exam  Nursing note and vitals reviewed. Constitutional: He is oriented to person, place, and time. He appears well-developed.  HENT:  Right nare - packing, with blood oozing out.   Cardiovascular: Normal rate.   Pulmonary/Chest: Effort normal.  Neurological: He is alert and oriented to person,  place, and time.    ED Course  FOREIGN BODY REMOVAL Date/Time: 03/14/2014 4:00 AM Performed by: Derwood KaplanNANAVATI, Roselani Grajeda Authorized by: Derwood KaplanNANAVATI, Ruthvik Barnaby Consent: Verbal consent obtained. Risks and benefits: risks, benefits and alternatives were discussed Consent given by: patient Body area: nose Location details: left nostril Patient sedated: no Removal mechanism: forceps Complexity: simple 1 objects recovered. Objects recovered: MEROSEL PACKING MATERIAL Post-procedure assessment: foreign body removed Patient tolerance: Patient tolerated the procedure well with no immediate complications.   (including critical care time) Labs Review Labs Reviewed - No data to display  Imaging Review No results found.   EKG Interpretation None      MDM   Final diagnoses:  Epistaxis    Pt with EPISTAXIS. Packing removed. Pt given afrin - and bleeding improved drastically. He has been having intermittent bleed since then. Pt refusing packing in the ER. Spoke with Dr. Pollyann Kennedyosen, who will see the patient at 9 am.   Derwood KaplanAnkit Phelan Schadt, MD 03/15/14 [phone]

## 2014-03-17 ENCOUNTER — Encounter (HOSPITAL_COMMUNITY): Payer: Self-pay | Admitting: Otolaryngology

## 2014-03-19 NOTE — Addendum Note (Signed)
Addendum created 03/19/14 2024 by Kipp Broodavid Starkisha Tullis, MD   Modules edited: Anesthesia Responsible Staff

## 2014-08-14 ENCOUNTER — Encounter (HOSPITAL_COMMUNITY): Payer: Self-pay | Admitting: Otolaryngology

## 2015-01-02 ENCOUNTER — Emergency Department (HOSPITAL_COMMUNITY)
Admission: EM | Admit: 2015-01-02 | Discharge: 2015-01-03 | Discharge status: Against Medical Advice | Payer: Self-pay | Attending: Emergency Medicine | Admitting: Emergency Medicine

## 2015-01-02 DIAGNOSIS — Z720 Tobacco use: Secondary | ICD-10-CM | POA: Insufficient documentation

## 2015-01-02 DIAGNOSIS — R5383 Other fatigue: Secondary | ICD-10-CM | POA: Insufficient documentation

## 2015-01-02 DIAGNOSIS — R509 Fever, unspecified: Secondary | ICD-10-CM | POA: Insufficient documentation

## 2015-01-02 DIAGNOSIS — M791 Myalgia: Secondary | ICD-10-CM | POA: Insufficient documentation

## 2015-01-03 ENCOUNTER — Encounter (HOSPITAL_COMMUNITY): Payer: Self-pay | Admitting: Emergency Medicine

## 2015-01-03 NOTE — ED Notes (Signed)
Pt from home c/o body aches, chills, fatigue, and fever. He reports he was bit by a tick a week ago and states "I think I have lymes disease after reading about it online". Pt voice sounds congested he denies couhg.

## 2015-01-03 NOTE — ED Notes (Signed)
Pt reports to registration that he is leaving and will come back tomorrow.

## 2015-01-04 ENCOUNTER — Encounter (HOSPITAL_COMMUNITY): Payer: Self-pay | Admitting: Emergency Medicine

## 2015-01-04 ENCOUNTER — Emergency Department (HOSPITAL_COMMUNITY)
Admission: EM | Admit: 2015-01-04 | Discharge: 2015-01-04 | Disposition: A | Discharge status: Home / Self Care | Payer: Self-pay | Attending: Emergency Medicine | Admitting: Emergency Medicine

## 2015-01-04 DIAGNOSIS — Y9389 Activity, other specified: Secondary | ICD-10-CM | POA: Insufficient documentation

## 2015-01-04 DIAGNOSIS — Y9289 Other specified places as the place of occurrence of the external cause: Secondary | ICD-10-CM | POA: Insufficient documentation

## 2015-01-04 DIAGNOSIS — Z792 Long term (current) use of antibiotics: Secondary | ICD-10-CM | POA: Insufficient documentation

## 2015-01-04 DIAGNOSIS — Z720 Tobacco use: Secondary | ICD-10-CM | POA: Insufficient documentation

## 2015-01-04 DIAGNOSIS — W57XXXA Bitten or stung by nonvenomous insect and other nonvenomous arthropods, initial encounter: Secondary | ICD-10-CM

## 2015-01-04 DIAGNOSIS — M791 Myalgia, unspecified site: Secondary | ICD-10-CM

## 2015-01-04 DIAGNOSIS — R519 Headache, unspecified: Secondary | ICD-10-CM

## 2015-01-04 DIAGNOSIS — S20361A Insect bite (nonvenomous) of right front wall of thorax, initial encounter: Secondary | ICD-10-CM | POA: Insufficient documentation

## 2015-01-04 DIAGNOSIS — Y998 Other external cause status: Secondary | ICD-10-CM | POA: Insufficient documentation

## 2015-01-04 DIAGNOSIS — R51 Headache: Secondary | ICD-10-CM | POA: Insufficient documentation

## 2015-01-04 LAB — COMPREHENSIVE METABOLIC PANEL
ALT: 38 U/L (ref 17–63)
AST: 36 U/L (ref 15–41)
Albumin: 4 g/dL (ref 3.5–5.0)
Alkaline Phosphatase: 69 U/L (ref 38–126)
Anion gap: 8 (ref 5–15)
BILIRUBIN TOTAL: 0.2 mg/dL — AB (ref 0.3–1.2)
BUN: 11 mg/dL (ref 6–20)
CALCIUM: 9.1 mg/dL (ref 8.9–10.3)
CO2: 26 mmol/L (ref 22–32)
Chloride: 107 mmol/L (ref 101–111)
Creatinine, Ser: 0.8 mg/dL (ref 0.61–1.24)
GFR calc Af Amer: >60 mL/min (ref >60)
GFR calc non Af Amer: >60 mL/min (ref >60)
Glucose, Bld: 97 mg/dL (ref 65–99)
Potassium: 3.9 mmol/L (ref 3.5–5.1)
Sodium: 141 mmol/L (ref 135–145)
Total Protein: 7 g/dL (ref 6.5–8.1)

## 2015-01-04 LAB — CBC WITH DIFFERENTIAL/PLATELET
Basophils Absolute: 0 10*3/uL (ref 0.0–0.1)
Basophils Relative: 1 % (ref 0–1)
EOS PCT: 3 % (ref 0–5)
Eosinophils Absolute: 0.1 10*3/uL (ref 0.0–0.7)
HEMATOCRIT: 44.9 % (ref 39.0–52.0)
HEMOGLOBIN: 15.3 g/dL (ref 13.0–17.0)
Lymphocytes Relative: 46 % (ref 12–46)
Lymphs Abs: 1.7 10*3/uL (ref 0.7–4.0)
MCH: 29.7 pg (ref 26.0–34.0)
MCHC: 34.1 g/dL (ref 30.0–36.0)
MCV: 87.2 fL (ref 78.0–100.0)
MONO ABS: 0.3 10*3/uL (ref 0.1–1.0)
MONOS PCT: 8 % (ref 3–12)
NEUTROS ABS: 1.5 10*3/uL — AB (ref 1.7–7.7)
NEUTROS PCT: 42 % — AB (ref 43–77)
PLATELETS: 135 10*3/uL — AB (ref 150–400)
RBC: 5.15 MIL/uL (ref 4.22–5.81)
RDW: 15.3 % (ref 11.5–15.5)
WBC: 3.6 10*3/uL — ABNORMAL LOW (ref 4.0–10.5)

## 2015-01-04 MED ORDER — DOXYCYCLINE HYCLATE 100 MG PO CAPS: 100.0000 mg | ORAL_CAPSULE | Freq: Two times a day (BID) | ORAL | Status: DC

## 2015-01-04 NOTE — ED Provider Notes (Signed)
CSN: [id number]     Arrival date & time 01/04/15  1326 History   First MD Initiated Contact with Patient 01/04/15 1502     Chief Complaint  Patient presents with  . Tick Removal  . Fatigue  . Generalized Body Aches     (Consider location/radiation/quality/duration/timing/severity/associated sxs/prior Treatment) HPI Comments: Patient presents today with complaints of fatigue, body aches, arthralgia, headache, and intermittent fevers.  He reports that symptoms have been occurring intermittently over the past week.  He states that he was bitten by a tick on the upper chest two weeks ago.  He states that he was able to remove the tic after the bite.  He is unsure how long the tic was attached.  He is also unsure how high his fever was because he never actually checked it with a thermometer.  He reports that the joint pain is located knee pain, ankle pain, hip pain, and elbows bilaterally.  He denies chest pain, SOB, syncope, nausea, vomiting, diarrhea, confusion, or rash.  No known sick contacts.  He is otherwise healthy.  The history is provided by the patient.    Past Medical History  Diagnosis Date  . Bleeding from the nose    Past Surgical History  Procedure Laterality Date  . Nasal endoscopy with epistaxis control Left 03/15/2014    Procedure: NASAL ENDOSCOPY WITH EPISTAXIS CONTROL;  Surgeon: Suzanna ObeyJohn Byers, MD;  [facility];  Service: ENT;  Laterality: Left;   No family history on file. History  Substance Use Topics  . Smoking status: Current Every Day Smoker    Types: Cigarettes  . Smokeless tobacco: Not on file  . Alcohol Use: Yes    Review of Systems  All other systems reviewed and are negative.     Allergies  Review of patient's allergies indicates no known allergies.  Home Medications   Prior to Admission medications   Medication Sig Start Date End Date Taking? Authorizing Provider  acetaminophen (TYLENOL) 500 MG tablet Take 1,000 mg by mouth every 6 (six) hours  as needed for moderate pain.   Yes Historical Provider, MD  cephALEXin (KEFLEX) 500 MG capsule Take 1 capsule (500 mg total) by mouth 3 (three) times daily. Patient not taking: Reported on 01/02/2015 03/15/14   Suzanna ObeyJohn Byers, MD  HYDROcodone-acetaminophen (HYCET) 7.5-325 mg/15 ml solution Take 15 mLs by mouth 4 (four) times daily as needed for moderate pain. Patient not taking: Reported on 01/02/2015 03/15/14 03/15/15  Suzanna ObeyJohn Byers, MD   BP 115/69 mmHg  Pulse 82  Temp(Src) 98.6 F (37 C) (Oral)  Resp 18  Ht 6' (1.829 m)  Wt 200 lb (90.719 kg)  BMI 27.12 kg/m2  SpO2 100% Physical Exam  Constitutional: He appears well-nourished.  HENT:  Head: Normocephalic and atraumatic.  Mouth/Throat: Oropharynx is clear and moist.  Neck: Normal range of motion. Neck supple.  Cardiovascular: Normal rate, regular rhythm and normal heart sounds.   Pulmonary/Chest: Effort normal and breath sounds normal.  Abdominal: Soft. Bowel sounds are normal. He exhibits no distension and no mass. There is no tenderness. There is no rebound and no guarding.  Musculoskeletal: Normal range of motion.  No erythema, warmth, or edema of the joints  Neurological: He is alert.  Skin: Skin is warm and dry.  Psychiatric: He has a normal mood and affect.  Nursing note and vitals reviewed.   ED Course  Procedures (including critical care time) Labs Review Labs Reviewed  CBC WITH DIFFERENTIAL/PLATELET  COMPREHENSIVE METABOLIC PANEL  Imaging Review No results found.   EKG Interpretation None      MDM   Final diagnoses:  None   Patient presents today with headache, body aches, fatigue, intermittent fevers, and arthralgia for the past week.  He reports being bitten by a tick 2 weeks ago, which he removed himself.  Patient is non toxic appearing.  No signs of confusion.  No nuchal rigidity.  He is afebrile in the ED.  No signs of infection on exam.  No rash visualized.  Labs unremarkable.  Feel that the patient is stable  for discharge.  Patient discharged home with Rx for Doxycycline.  Return precautions given.    Santiago Glad, PA-C 01/05/15 0144  Glynn Octave, MD 01/05/15 [phone]

## 2015-01-04 NOTE — ED Notes (Signed)
Pt Sts he was bitten by a tick two weeks ago. Pt has no lingering marks. Pt sts he is concerned because he has had intermittent fever, fatigue, trouble sleeping, and generalized body aches ever since. Denies N/V. Pt also c/o joint pain. A&Ox4 and ambulatory. NAD noted.

## 2016-07-25 ENCOUNTER — Emergency Department (HOSPITAL_COMMUNITY)
Admission: EM | Admit: 2016-07-25 | Discharge: 2016-07-25 | Disposition: A | Discharge status: Home / Self Care | Payer: Self-pay | Attending: Emergency Medicine | Admitting: Emergency Medicine

## 2016-07-25 ENCOUNTER — Encounter (HOSPITAL_COMMUNITY): Payer: Self-pay | Admitting: Emergency Medicine

## 2016-07-25 ENCOUNTER — Emergency Department (HOSPITAL_COMMUNITY): Payer: Self-pay

## 2016-07-25 DIAGNOSIS — F17210 Nicotine dependence, cigarettes, uncomplicated: Secondary | ICD-10-CM | POA: Insufficient documentation

## 2016-07-25 DIAGNOSIS — W010XXA Fall on same level from slipping, tripping and stumbling without subsequent striking against object, initial encounter: Secondary | ICD-10-CM | POA: Insufficient documentation

## 2016-07-25 DIAGNOSIS — Y9389 Activity, other specified: Secondary | ICD-10-CM | POA: Insufficient documentation

## 2016-07-25 DIAGNOSIS — M25511 Pain in right shoulder: Secondary | ICD-10-CM | POA: Insufficient documentation

## 2016-07-25 DIAGNOSIS — S93402A Sprain of unspecified ligament of left ankle, initial encounter: Secondary | ICD-10-CM | POA: Insufficient documentation

## 2016-07-25 DIAGNOSIS — Y92810 Car as the place of occurrence of the external cause: Secondary | ICD-10-CM | POA: Insufficient documentation

## 2016-07-25 DIAGNOSIS — Y999 Unspecified external cause status: Secondary | ICD-10-CM | POA: Insufficient documentation

## 2016-07-25 MED ORDER — IBUPROFEN 800 MG PO TABS
800.0000 mg | ORAL_TABLET | Freq: Once | ORAL | Status: AC
  Administered 2016-07-25: 800 mg via ORAL
  Filled 2016-07-25: qty 1

## 2016-07-25 MED ORDER — NAPROXEN 500 MG PO TABS: 500.0000 mg | ORAL_TABLET | Freq: Two times a day (BID) | ORAL | 0 refills | Status: DC
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
## 2016-07-25 NOTE — Discharge Instructions (Signed)
Wear ankle splint orthotic,use crutches as needed.
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
## 2019-01-08 ENCOUNTER — Other Ambulatory Visit: Payer: Self-pay

## 2019-01-08 ENCOUNTER — Emergency Department (HOSPITAL_COMMUNITY)
Admission: EM | Admit: 2019-01-08 | Discharge: 2019-01-09 | Disposition: A | Discharge status: Home / Self Care | Payer: Self-pay | Attending: Emergency Medicine | Admitting: Emergency Medicine

## 2019-01-08 ENCOUNTER — Encounter (HOSPITAL_COMMUNITY): Payer: Self-pay | Admitting: Emergency Medicine

## 2019-01-08 DIAGNOSIS — F17210 Nicotine dependence, cigarettes, uncomplicated: Secondary | ICD-10-CM | POA: Insufficient documentation

## 2019-01-08 DIAGNOSIS — R197 Diarrhea, unspecified: Secondary | ICD-10-CM | POA: Insufficient documentation

## 2019-01-08 DIAGNOSIS — R10812 Left upper quadrant abdominal tenderness: Secondary | ICD-10-CM | POA: Insufficient documentation

## 2019-01-08 DIAGNOSIS — R110 Nausea: Secondary | ICD-10-CM | POA: Insufficient documentation

## 2019-01-08 DIAGNOSIS — R1032 Left lower quadrant pain: Secondary | ICD-10-CM | POA: Insufficient documentation

## 2019-01-08 LAB — CBC
HCT: 47.2 % (ref 39.0–52.0)
Hemoglobin: 16.3 g/dL (ref 13.0–17.0)
MCH: 31 pg (ref 26.0–34.0)
MCHC: 34.5 g/dL (ref 30.0–36.0)
MCV: 89.7 fL (ref 80.0–100.0)
Platelets: 225 10*3/uL (ref 150–400)
RBC: 5.26 MIL/uL (ref 4.22–5.81)
RDW: 13.2 % (ref 11.5–15.5)
WBC: 12.1 10*3/uL — ABNORMAL HIGH (ref 4.0–10.5)
nRBC: 0 % (ref 0.0–0.2)

## 2019-01-08 LAB — COMPREHENSIVE METABOLIC PANEL
ALT: 18 U/L (ref 0–44)
AST: 16 U/L (ref 15–41)
Albumin: 3.8 g/dL (ref 3.5–5.0)
Alkaline Phosphatase: 97 U/L (ref 38–126)
Anion gap: 9 (ref 5–15)
BUN: 10 mg/dL (ref 6–20)
CO2: 24 mmol/L (ref 22–32)
Calcium: 9.4 mg/dL (ref 8.9–10.3)
Chloride: 107 mmol/L (ref 98–111)
Creatinine, Ser: 0.99 mg/dL (ref 0.61–1.24)
GFR calc Af Amer: >60 mL/min (ref >60)
GFR calc non Af Amer: >60 mL/min (ref >60)
Glucose, Bld: 118 mg/dL — ABNORMAL HIGH (ref 70–99)
Potassium: 3.5 mmol/L (ref 3.5–5.1)
Sodium: 140 mmol/L (ref 135–145)
Total Bilirubin: 0.5 mg/dL (ref 0.3–1.2)
Total Protein: 6.9 g/dL (ref 6.5–8.1)

## 2019-01-08 LAB — URINALYSIS, ROUTINE W REFLEX MICROSCOPIC
Bilirubin Urine: NEGATIVE
Glucose, UA: NEGATIVE mg/dL
Hgb urine dipstick: NEGATIVE
Ketones, ur: NEGATIVE mg/dL
Leukocytes,Ua: NEGATIVE
Nitrite: NEGATIVE
Protein, ur: NEGATIVE mg/dL
Specific Gravity, Urine: 1.029 (ref 1.005–1.030)
pH: 5 (ref 5.0–8.0)

## 2019-01-08 LAB — LIPASE, BLOOD: Lipase: 55 U/L — ABNORMAL HIGH (ref 11–51)

## 2019-01-08 MED ORDER — SODIUM CHLORIDE 0.9% FLUSH: 3.0000 mL | Freq: Once | INTRAVENOUS | Status: DC

## 2019-01-08 NOTE — ED Triage Notes (Signed)
Pt c/o abdominal pain with nausea/vomiting/diarrhea that started yesterday. Nausea/diarrhea have subsided today, still complaining of abdominal pain.

## 2019-01-09 ENCOUNTER — Emergency Department (HOSPITAL_COMMUNITY): Payer: Self-pay

## 2019-01-09 ENCOUNTER — Encounter (HOSPITAL_COMMUNITY): Payer: Self-pay | Admitting: Radiology

## 2019-01-09 MED ORDER — ONDANSETRON HCL 4 MG/2ML IJ SOLN
4.0000 mg | Freq: Once | INTRAMUSCULAR | Status: AC
Start: 2019-01-09 — End: 2019-01-09
  Administered 2019-01-09: 4 mg via INTRAVENOUS
  Filled 2019-01-09: qty 2

## 2019-01-09 MED ORDER — IOHEXOL 300 MG/ML  SOLN
100.0000 mL | Freq: Once | INTRAMUSCULAR | Status: AC | PRN
  Administered 2019-01-09: 100 mL via INTRAVENOUS

## 2019-01-09 MED ORDER — SODIUM CHLORIDE 0.9 % IV BOLUS
1000.0000 mL | Freq: Once | INTRAVENOUS | Status: AC
  Administered 2019-01-09: 1000 mL via INTRAVENOUS

## 2019-01-09 NOTE — Discharge Instructions (Addendum)
You have been seen today for abdominal pain and diarrhea. Please read and follow all provided instructions.   1. Medications: usual home medications 2. Treatment: rest, drink plenty of fluids 3. Follow Up: Please follow up with your primary doctor in 2-5 days for discussion of your diagnoses and further evaluation after today's visit; if you do not have a primary care doctor use the resource guide provided to find one; Please return to the ER for any new or worsening symptoms. Please obtain all of your results from medical records or have your doctors office obtain the results - share them with your doctor - you should be seen at your doctors office. Call today to arrange your follow up.   Take medications as prescribed. Please review all of the medicines and only take them if you do not have an allergy to them. Return to the emergency room for worsening condition or new concerning symptoms. Follow up with your regular doctor. If you don't have a regular doctor use one of the numbers below to establish a primary care doctor.  Please be aware that if you are taking birth control pills, taking other prescriptions, ESPECIALLY ANTIBIOTICS may make the birth control ineffective - if this is the case, either do not engage in sexual activity or use alternative methods of birth control such as condoms until you have finished the medicine and your family doctor says it is OK to restart them. If you are on a blood thinner such as COUMADIN, be aware that any other medicine that you take may cause the coumadin to either work too much, or not enough - you should have your coumadin level rechecked in next 7 days if this is the case.  ?  It is also a possibility that you have an allergic reaction to any of the medicines that you have been prescribed - Everybody reacts differently to medications and while MOST people have no trouble with most medicines, you may have a reaction such as nausea, vomiting, rash, swelling,  shortness of breath. If this is the case, please stop taking the medicine immediately and contact your physician.  ?  You should return to the ER if you develop severe or worsening symptoms.   Emergency Department Resource Guide 1) Find a Doctor and Pay Out of Pocket Although you won't have to find out who is covered by your insurance plan, it is a good idea to ask around and get recommendations. You will then need to call the office and see if the doctor you have chosen will accept you as a new patient and what types of options they offer for patients who are self-pay. Some doctors offer discounts or will set up payment plans for their patients who do not have insurance, but you will need to ask so you aren't surprised when you get to your appointment.  2) Contact Your Local Health Department Not all health departments have doctors that can see patients for sick visits, but many do, so it is worth a call to see if yours does. If you don't know where your local health department is, you can check in your phone book. The CDC also has a tool to help you locate your state's health department, and many state websites also have listings of all of their local health departments.  3) Find a Walk-in Clinic If your illness is not likely to be very severe or complicated, you may want to try a walk in clinic. These are popping up  all over the country in pharmacies, drugstores, and shopping centers. They're usually staffed by nurse practitioners or physician assistants that have been trained to treat common illnesses and complaints. They're usually fairly quick and inexpensive. However, if you have serious medical issues or chronic medical problems, these are probably not your best option.  No Primary Care Doctor: Call Health Connect at  [phone] - they can help you locate a primary care doctor that  accepts your insurance, provides certain services, etc. Physician Referral Service- [phone]  Chronic  Pain Problems: Organization         Address  Phone   Notes  [facility]  [phone] Patients need to be referred by their primary care doctor.   Medication Assistance: Organization         Address  Phone   Notes  [facility] Medication [facility] [address] [place], [place] [address] [phone] --Must be a resident of [facility] -- Must have NO insurance coverage whatsoever (no Medicaid/ Medicare, etc.) -- The pt. MUST have a primary care doctor that directs their care regularly and follows them in the community   [facility]  [phone]   [employer]  [phone]    Agencies that provide inexpensive medical care: Organization         Address  Phone   Notes  [place]  [phone]   [facility]    [phone]   [facility] [place], [place] [address] [phone]   [place] [address] [place] [phone]   [facility]    [phone]   [facility]    [phone]   [place] and [place] [address], [place] Phone:  [phone], Fax:  [phone] Hours of Operation:  9 am - 6 pm, M-F.  Also accepts Medicaid/Medicare and self-pay.  [facility] for [place], [address], [place] Phone: [phone], Fax: [phone]. Hours of Operation:  8:30 am - 5:30 pm, M-F.  Also accepts Medicaid and self-pay.  [facility] [address], [address] Phone: [phone]   [place], [place], [place] [phone], Ext. [phone] Mondays & Thursdays: 7-9 AM.  First 15 patients are seen on a first come, first serve basis.    [place] Providers:  Organization         Address  Phone   Notes  [facility] [address],  [phone] Also  accepts self-pay patients.  [facility] [address], [place]  [phone]   [place], [address], [place] [phone]   [facility] [address], [place] [phone]   Lucianne Lei [address], [place]   [phone] Only accepts [place] patients after they have their name applied to their card.   Self-Pay (no insurance) in [facility]:  Organization         Address  Phone   Notes  Sickle Cell Patients, [facility] [place] [phone]   [facility] [place] [phone]   [facility] [place]  [address], [place] [phone]   [facility]/Dr. Osei-Bonsu  [address], [place] or [address], [place] [phone] Phone number for both [place] and [place] locations is the same.  [facility] [address] [place] [phone]   [facility] [address], [place] or [address] [phone]   [facility] [address] [place] [phone], phone; [phone], fax Sees patients 1st and 3rd Saturday of every month.  Must not qualify for public or private insurance (i.e. Medicaid, Medicare, [place] Health Choice, Veterans' Benefits)  Household income should be no more than 200% of the poverty level The clinic cannot treat you if you are pregnant or think you are pregnant  Sexually transmitted diseases are not treated at the clinic.

## 2019-01-09 NOTE — ED Notes (Signed)
Patient transported to CT 

## 2019-01-09 NOTE — ED Notes (Signed)
Patient verbalizes understanding of discharge instructions. Opportunity for questioning and answers were provided. Armband removed by staff, pt discharged from ED.  

## 2019-01-09 NOTE — ED Provider Notes (Signed)
[facility]   CSN: [id number] Arrival date & time: 01/08/19  2030    History   Chief Complaint Chief Complaint  Patient presents with   Abdominal Pain    HPI Zachary Callahan is a 28 y.o. male with no significant past medical history presenting with intermittent left sided abdominal pain onset 6 days ago. Patient describes pain as tightness and states it is worse with eating. Patient reports his pain has improved since onset. Patient states Pepto Bismol alleviates his pain. Patient reports multiple episodes of yellow diarrhea. Patient reports nausea, but denies vomiting. Patient denies previous abdominal surgeries. Patient reports occasional alcohol use and marijuana use. Patient denies any alcohol or marijuana use today. Patient denies fever, chills, cough, congestion, rhinorrhea, sore throat, chest pain, shortness of breath, sick contacts, or recent travel. Patient denies dysuria, hematuria, or frequency.      HPI  Past Medical History:  Diagnosis Date   Bleeding from the nose     There are no active problems to display for this patient.   Past Surgical History:  Procedure Laterality Date   NASAL ENDOSCOPY WITH EPISTAXIS CONTROL Left 03/15/2014   Procedure: NASAL ENDOSCOPY WITH EPISTAXIS CONTROL;  Surgeon: Suzanna ObeyJohn Byers, MD;  [facility];  Service: ENT;  Laterality: Left;        Home Medications    Prior to Admission medications   Medication Sig Start Date End Date Taking? Authorizing Provider  acetaminophen (TYLENOL) 500 MG tablet Take 1,000 mg by mouth every 6 (six) hours as needed for moderate pain.    [provider]  doxycycline (VIBRAMYCIN) 100 MG capsule Take 1 capsule (100 mg total) by mouth 2 (two) times daily. 01/04/15   Santiago GladLaisure, Heather, PA-C  naproxen (NAPROSYN) 500 MG tablet Take 1 tablet (500 mg total) by mouth 2 (two) times daily. 07/25/16   Dione BoozeGlick, David, MD    Family History No family history  on file.  Social History Social History   Tobacco Use   Smoking status: Current Every Day Smoker    Types: Cigarettes   Smokeless tobacco: Never Used  Substance Use Topics   Alcohol use: Yes   Drug use: No     Allergies   Patient has no known allergies.   Review of Systems Review of Systems  Constitutional: Negative for activity change, appetite change, chills, fever and unexpected weight change.  HENT: Negative for congestion, rhinorrhea and sore throat.   Eyes: Negative for visual disturbance.  Respiratory: Negative for cough and shortness of breath.   Cardiovascular: Negative for chest pain.  Gastrointestinal: Positive for abdominal pain, diarrhea and nausea. Negative for blood in stool, constipation and vomiting.  Endocrine: Negative for polydipsia, polyphagia and polyuria.  Genitourinary: Negative for dysuria, flank pain and frequency.  Musculoskeletal: Negative for back pain.  Skin: Negative for rash.  Allergic/Immunologic: Negative for immunocompromised state.  Neurological: Negative for dizziness, weakness, light-headedness and headaches.  Hematological: Does not bruise/bleed easily.  Psychiatric/Behavioral: The patient is not nervous/anxious.     Physical Exam Updated Vital Signs BP 111/70    Pulse (!) 53    Temp 98.8 F (37.1 C) (Oral)    Resp 16    Ht 6' (1.829 m)    Wt 88.5 kg    SpO2 97%    BMI 26.45 kg/m   Physical Exam Vitals signs and nursing note reviewed.  Constitutional:      General: He is not in acute distress.  Appearance: He is well-developed. He is not diaphoretic.  HENT:     Head: Normocephalic and atraumatic.     Mouth/Throat:     Mouth: Mucous membranes are dry.     Pharynx: Oropharynx is clear. Uvula midline. No posterior oropharyngeal erythema.  Eyes:     General: No scleral icterus. Neck:     Musculoskeletal: Normal range of motion and neck supple.  Cardiovascular:     Rate and Rhythm: Normal rate and regular rhythm.      Heart sounds: Normal heart sounds. No murmur. No friction rub. No gallop.   Pulmonary:     Effort: Pulmonary effort is normal. No respiratory distress.     Breath sounds: Normal breath sounds. No wheezing or rales.  Abdominal:     General: Bowel sounds are increased. There is no distension.     Palpations: Abdomen is soft. Abdomen is not rigid. There is no mass.     Tenderness: There is abdominal tenderness in the left upper quadrant and left lower quadrant. There is no right CVA tenderness, left CVA tenderness, guarding or rebound.     Hernia: No hernia is present.  Musculoskeletal: Normal range of motion.  Skin:    General: Skin is warm.     Findings: No rash.  Neurological:     Mental Status: He is alert and oriented to person, place, and time.    ED Treatments / Results  Labs (all labs ordered are listed, but only abnormal results are displayed) Labs Reviewed  LIPASE, BLOOD - Abnormal; Notable for the following components:      Result Value   Lipase 55 (*)    All other components within normal limits  COMPREHENSIVE METABOLIC PANEL - Abnormal; Notable for the following components:   Glucose, Bld 118 (*)    All other components within normal limits  CBC - Abnormal; Notable for the following components:   WBC 12.1 (*)    All other components within normal limits  URINALYSIS, ROUTINE W REFLEX MICROSCOPIC    EKG None  Radiology Ct Abdomen Pelvis W Contrast  Result Date: 01/09/2019 CLINICAL DATA:  Abdominal pain with diverticulitis suspected. EXAM: CT ABDOMEN AND PELVIS WITH CONTRAST TECHNIQUE: Multidetector CT imaging of the abdomen and pelvis was performed using the standard protocol following bolus administration of intravenous contrast. CONTRAST:  123mL OMNIPAQUE IOHEXOL 300 MG/ML  SOLN COMPARISON:  None. FINDINGS: Lower chest: No acute abnormality. Hepatobiliary: No focal liver abnormality is seen. No gallstones, gallbladder wall thickening, or biliary dilatation. Pancreas:  Unremarkable. No pancreatic ductal dilatation or surrounding inflammatory changes. Spleen: Normal in size without focal abnormality. Adrenals/Urinary Tract: Adrenal glands are unremarkable. Kidneys are normal, without renal calculi, focal lesion, or hydronephrosis. Bladder is unremarkable. Stomach/Bowel: There is a moderate amount of stool in the colon. There is no CT evidence of diverticulitis or colitis. The appendix is located in the right lower quadrant and is normal. The stomach is distended with food material and is normal. Vascular/Lymphatic: No significant vascular findings are present. No enlarged abdominal or pelvic lymph nodes. Reproductive: Prostate is unremarkable. Other: No abdominal wall hernia or abnormality. No abdominopelvic ascites. Musculoskeletal: No acute or significant osseous findings. IMPRESSION: Normal study. No abnormality detected to explain the patient's abdominal pain. Electronically Signed   By: Constance Holster M.D.   On: 01/09/2019 02:23    Procedures Procedures (including critical care time)  Medications Ordered in ED Medications  sodium chloride flush (NS) 0.9 % injection 3 mL (has no administration in  time range)  sodium chloride 0.9 % bolus 1,000 mL (1,000 mLs Intravenous New Bag/Given 01/09/19 0056)  ondansetron (ZOFRAN) injection 4 mg (4 mg Intravenous Given 01/09/19 0054)  iohexol (OMNIPAQUE) 300 MG/ML solution 100 mL (100 mLs Intravenous Contrast Given 01/09/19 0153)     Initial Impression / Assessment and Plan / ED Course  I have reviewed the triage vital signs and the nursing notes.  Pertinent labs & imaging results that were available during my care of the patient were reviewed by me and considered in my medical decision making (see chart for details).  Clinical Course as of Jan 08 250  Wed Jan 09, 2019  0035 Leukocytosis noted at 12.1.  WBC(!): 12.1 [AH]  0035 Lipase mildly elevated at 55.   Lipase(!): 55 [AH]  0035 CMP reveals mild hyperglycemia  at 118, and other values within normal limits.  Comprehensive metabolic panel(!) [AH]  [id number] UA is negative.   Urinalysis, Routine w reflex microscopic [AH]  0226 Normal study. No abnormality detected to explain the patient's abdominal pain.    CT Abdomen Pelvis W Contrast [AH]    Clinical Course User Index [AH] Leretha DykesHernandez, Paitlyn Mcclatchey P, PA-C      Patient presents with abdominal pain, nausea, and diarrhea. Patient is nontoxic, nonseptic appearing, in no apparent distress.  Patient's pain and other symptoms adequately managed in emergency department.  Fluid bolus and antiemetics given.  Labs, imaging and vitals reviewed.  CT abdomen is negative. Patient does not meet the SIRS or Sepsis criteria.  On repeat exam patient does not have a surgical abdomin and there are no peritoneal signs.  No indication of appendicitis, bowel obstruction, bowel perforation, cholecystitis, or diverticulitis. Patient is stable in no acute distress. Patient discharged home with symptomatic treatment and given strict instructions for follow-up with their primary care physician.  I have also discussed reasons to return immediately to the ER.  Patient expresses understanding and agrees with plan.  Final Clinical Impressions(s) / ED Diagnoses   Final diagnoses:  Left lower quadrant abdominal pain  Diarrhea, unspecified type    ED Discharge Orders    None       Leretha DykesHernandez, Triva Hueber P, [place]PA-C 01/09/19 0253    Dione BoozeGlick, David, MD 01/09/19 2253

## 2019-03-07 ENCOUNTER — Other Ambulatory Visit: Payer: Self-pay

## 2019-03-07 DIAGNOSIS — Z20822 Contact with and (suspected) exposure to covid-19: Secondary | ICD-10-CM

## 2019-03-08 LAB — NOVEL CORONAVIRUS, NAA: SARS-CoV-2, NAA: NOT DETECTED

## 2021-03-30 IMAGING — CT CT ABDOMEN AND PELVIS WITH CONTRAST
2 of 4 series · 17 of 46 positions shown, 19 images · IV contrast (omnipaque)
Comparison: None.

CLINICAL DATA: Abdominal pain with diverticulitis suspected.

EXAM:
CT ABDOMEN AND PELVIS WITH CONTRAST
TECHNIQUE: Multidetector CT imaging of the abdomen and pelvis was performed
using the standard protocol following bolus administration of
intravenous contrast.
CONTRAST:  100mL OMNIPAQUE IOHEXOL 300 MG/ML  SOLN

[Series 3: abdomen 5.0 · axial · 0.87mm/px · z∈[+656,+1106]mm · 14 of 102 slices shown, 16 images]
[im 6/102  soft-tissue]
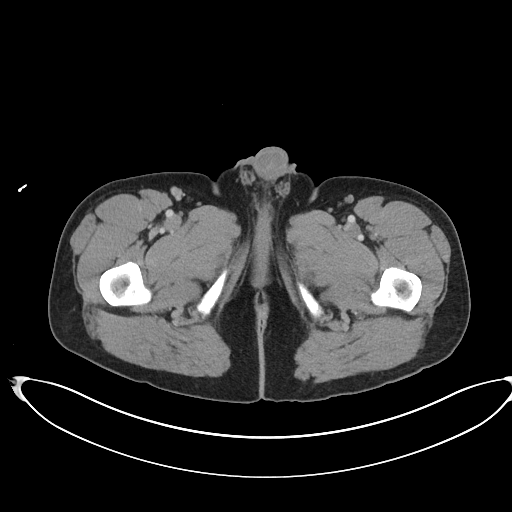
[im 6/102  bone]
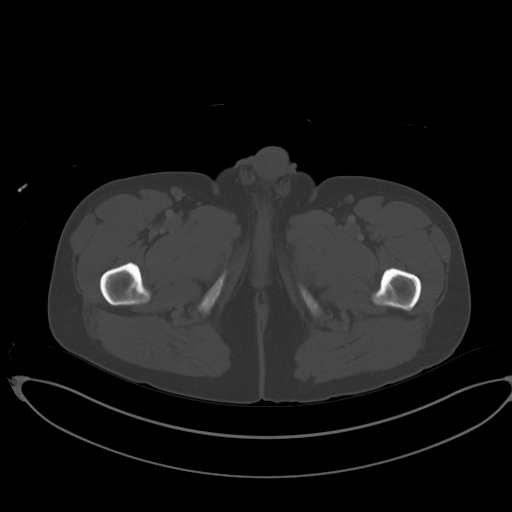
[im 12/102  soft-tissue]
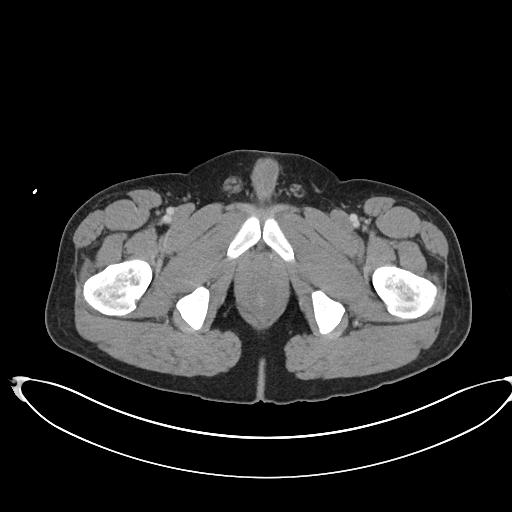
[im 23/102  soft-tissue]
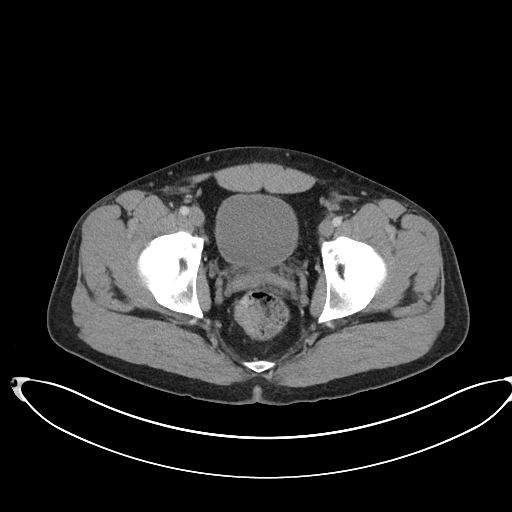
[im 29/102  soft-tissue]
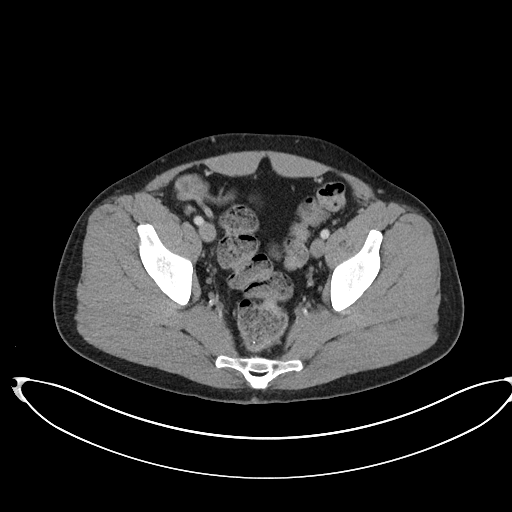
[im 34/102  soft-tissue]
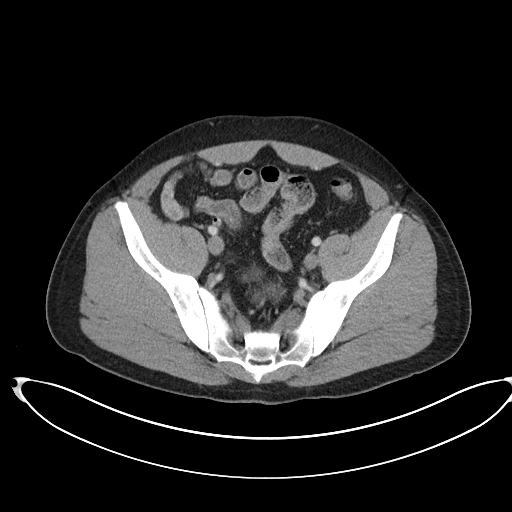
[im 40/102  soft-tissue]
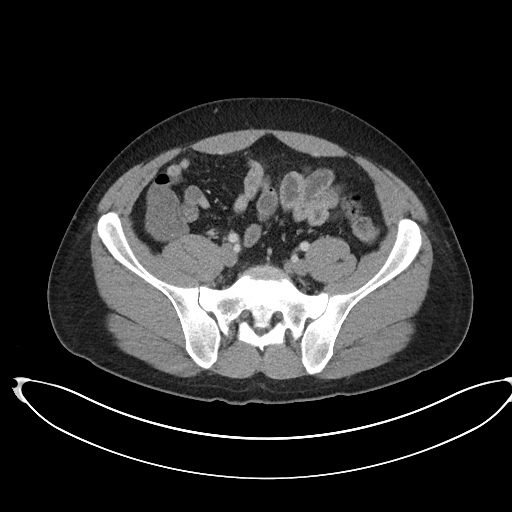
[im 45/102  soft-tissue]
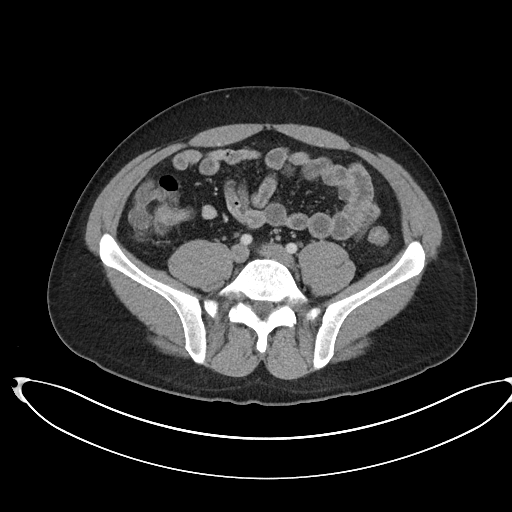
[im 57/102  soft-tissue]
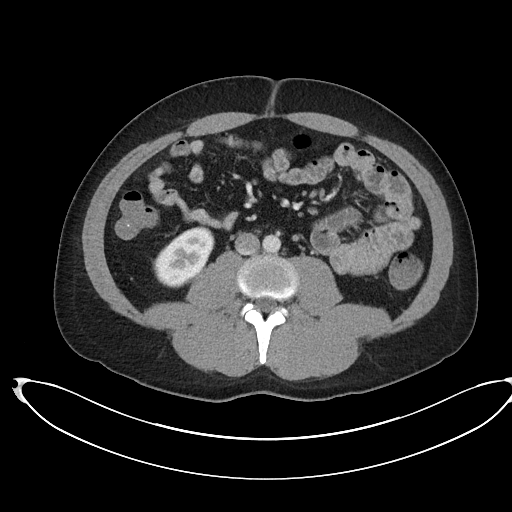
[im 62/102  soft-tissue]
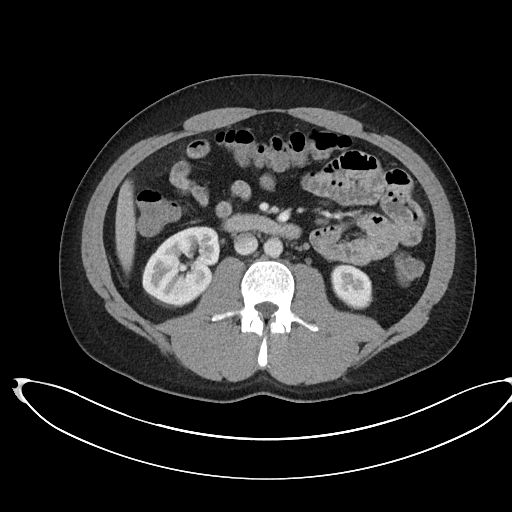
[im 62/102  bone]
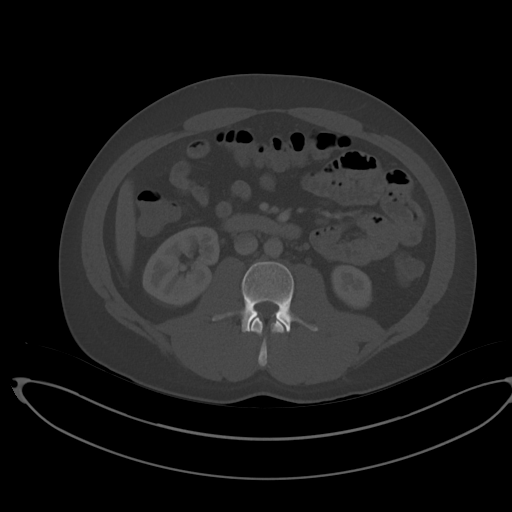
[im 68/102  soft-tissue]
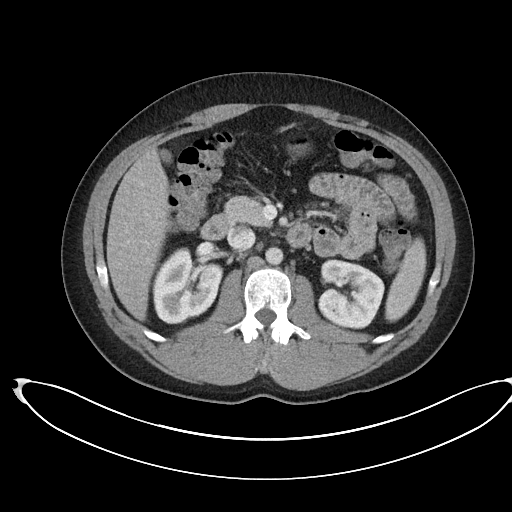
[im 73/102  soft-tissue]
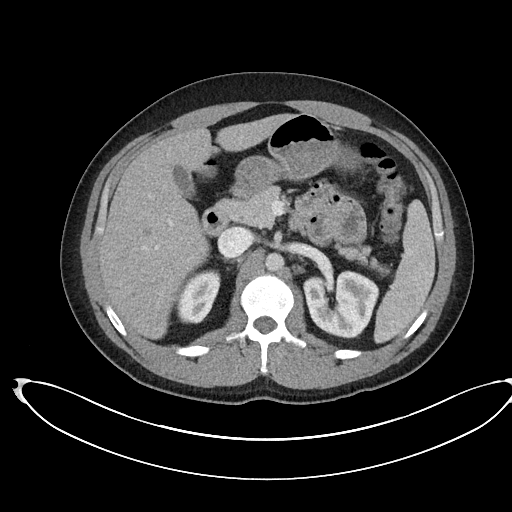
[im 79/102  soft-tissue]
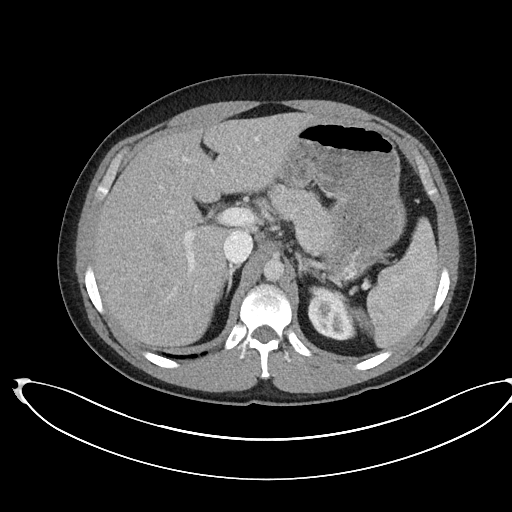
[im 90/102  soft-tissue]
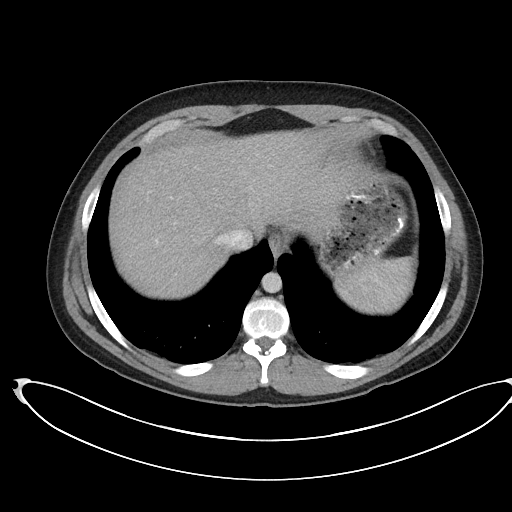
[im 96/102  soft-tissue]
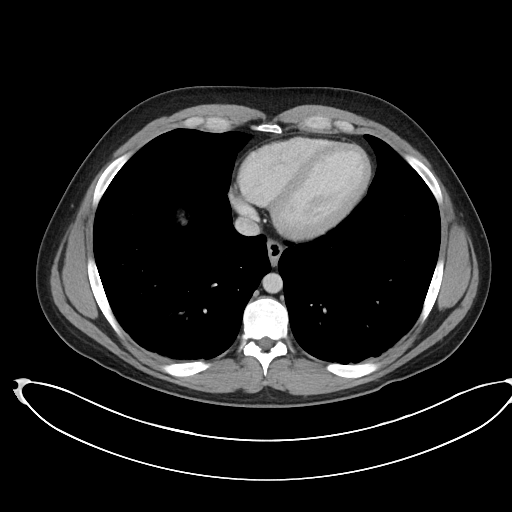

[Series 6: abdomen 3.0 mpr cor · coronal · 0.92mm/px · 3 of 106 slices shown]
[im 36/106  soft-tissue]
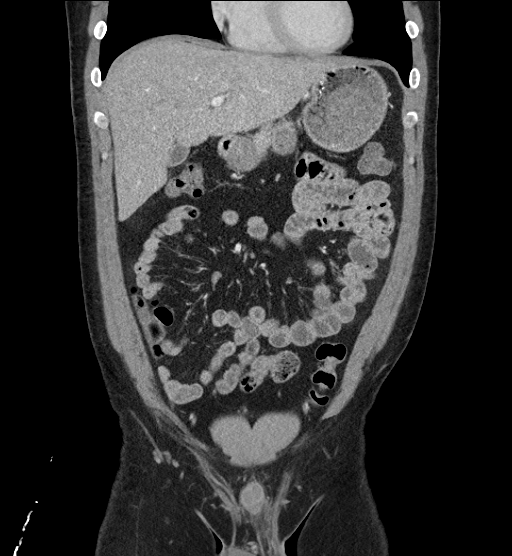
[im 47/106  soft-tissue]
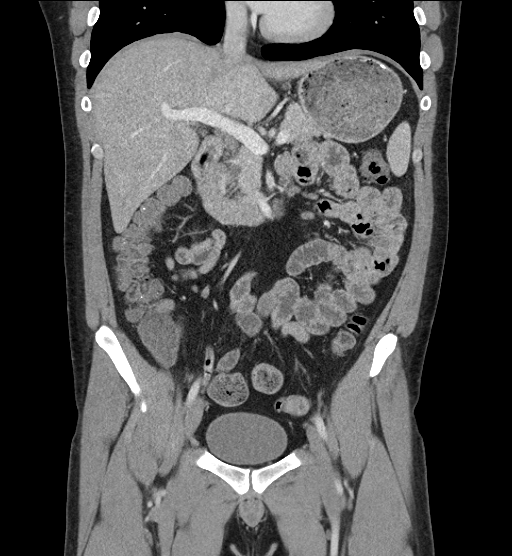
[im 59/106  soft-tissue]
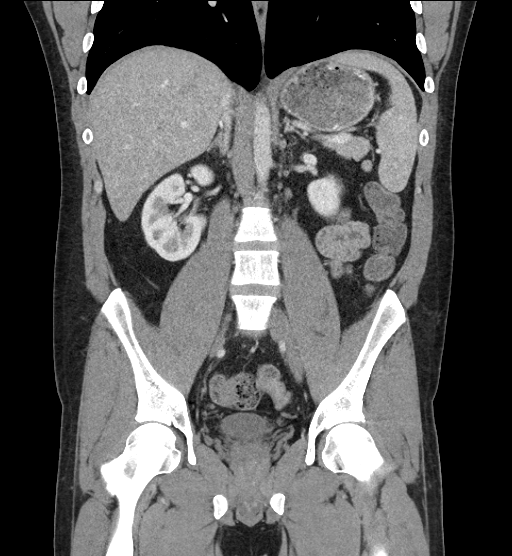

[17 of 46 positions shown; findings below may reference images not displayed]

FINDINGS: Lower chest: No acute abnormality.

Hepatobiliary: No focal liver abnormality is seen. No gallstones,
gallbladder wall thickening, or biliary dilatation.

Pancreas: Unremarkable. No pancreatic ductal dilatation or
surrounding inflammatory changes.

Spleen: Normal in size without focal abnormality.

Adrenals/Urinary Tract: Adrenal glands are unremarkable. Kidneys are
normal, without renal calculi, focal lesion, or hydronephrosis.
Bladder is unremarkable.

Stomach/Bowel: There is a moderate amount of stool in the colon.
There is no CT evidence of diverticulitis or colitis. The appendix
is located in the right lower quadrant and is normal. The stomach is
distended with food material and is normal.

Vascular/Lymphatic: No significant vascular findings are present. No
enlarged abdominal or pelvic lymph nodes.

Reproductive: Prostate is unremarkable.

Other: No abdominal wall hernia or abnormality. No abdominopelvic
ascites.

Musculoskeletal: No acute or significant osseous findings.
IMPRESSION: Normal study. No abnormality detected to explain the patient's
abdominal pain.

## 2023-04-14 DIAGNOSIS — Z1159 Encounter for screening for other viral diseases: Secondary | ICD-10-CM | POA: Diagnosis not present

## 2023-04-14 DIAGNOSIS — Z13220 Encounter for screening for lipoid disorders: Secondary | ICD-10-CM | POA: Diagnosis not present

## 2023-04-14 DIAGNOSIS — K921 Melena: Secondary | ICD-10-CM | POA: Diagnosis not present

## 2023-04-14 DIAGNOSIS — H16223 Keratoconjunctivitis sicca, not specified as Sjogren's, bilateral: Secondary | ICD-10-CM | POA: Diagnosis not present

## 2023-04-14 DIAGNOSIS — G4730 Sleep apnea, unspecified: Secondary | ICD-10-CM | POA: Diagnosis not present

## 2023-04-14 DIAGNOSIS — Z0000 Encounter for general adult medical examination without abnormal findings: Secondary | ICD-10-CM | POA: Diagnosis not present

## 2023-04-14 DIAGNOSIS — Z114 Encounter for screening for human immunodeficiency virus [HIV]: Secondary | ICD-10-CM | POA: Diagnosis not present

## 2023-05-26 DIAGNOSIS — S60562A Insect bite (nonvenomous) of left hand, initial encounter: Secondary | ICD-10-CM | POA: Diagnosis not present

## 2023-06-09 DIAGNOSIS — E782 Mixed hyperlipidemia: Secondary | ICD-10-CM | POA: Diagnosis not present

## 2023-06-09 DIAGNOSIS — R748 Abnormal levels of other serum enzymes: Secondary | ICD-10-CM | POA: Diagnosis not present

## 2023-06-09 DIAGNOSIS — K219 Gastro-esophageal reflux disease without esophagitis: Secondary | ICD-10-CM | POA: Diagnosis not present

## 2023-06-27 ENCOUNTER — Ambulatory Visit: Payer: BC Managed Care – PPO | Attending: Internal Medicine | Admitting: Internal Medicine

## 2023-06-27 ENCOUNTER — Encounter: Payer: Self-pay | Admitting: Internal Medicine

## 2023-06-27 VITALS — BP 126/82 | HR 81 | Ht 72.0 in | Wt 225.4 lb

## 2023-06-27 DIAGNOSIS — E7849 Other hyperlipidemia: Secondary | ICD-10-CM

## 2023-06-27 DIAGNOSIS — R748 Abnormal levels of other serum enzymes: Secondary | ICD-10-CM | POA: Diagnosis not present

## 2023-06-27 MED ORDER — ROSUVASTATIN CALCIUM 20 MG PO TABS: 20.0000 mg | ORAL_TABLET | Freq: Every day | ORAL | 3 refills | Status: DC

## 2023-06-27 NOTE — Progress Notes (Signed)
LIPID CLINIC CONSULT NOTE  Chief Complaint:  Familial hyperlipidemia  Primary Care Physician: Patient, No Pcp Per  Primary Cardiologist:  None  HPI:  Zachary Callahan is a 32 y.o. male who is being seen today for the evaluation of familial hyperlipidemia at the request of Zachary Poling, DO.  This a pleasant 32 year old male kindly referred for evaluation management of probable familial hyperlipidemia.  His lipids recently were significantly elevated with LDL 227, HDL 35 and triglycerides 204.  He was also noted to have mildly elevated liver enzymes with AST and ALT of 48-78.  He was using regular Tylenol and he was advised to discontinue that.  Subsequently decided to work more aggressively with his diet over the past several months he has done that.  He again had repeat lipid testing.  This showed a total cholesterol 268, triglycerides 250, HDL 37 and LDL 183.  Overall substantial reduction in his cholesterol with changes including reducing saturated fats, fried foods, cutting out red meats and making other adjustments.  He has also lost some weight.  LP(a) was assessed as well which was 43 nmol/L, negative for increased risk.  He does not report early onset heart disease in the family.  He says his father did have high cholesterol but is on medications.  He has a sister who is vegan but does not report that she has any high cholesterol.  Despite the changes in his medications his LDL remains quite high and is concerning for a genetic dyslipidemia.  PMHx:  Past Medical History:  Diagnosis Date   Bleeding from the Callahan    GERD (gastroesophageal reflux disease)    High cholesterol     Past Surgical History:  Procedure Laterality Date   NASAL ENDOSCOPY WITH EPISTAXIS CONTROL Left 03/15/2014   Procedure: NASAL ENDOSCOPY WITH EPISTAXIS CONTROL;  Surgeon: Zachary Obey, MD;  [facility] OR;  Service: ENT;  Laterality: Left;    FAMHx:  Family History  Problem Relation Age of Onset    Hyperlipidemia Father     SOCHx:   reports that he has been smoking cigarettes. He has never used smokeless tobacco. He reports current alcohol use. He reports that he does not use drugs.  ALLERGIES:  No Known Allergies  ROS: Pertinent items noted in HPI and remainder of comprehensive ROS otherwise negative.  HOME MEDS: Current Outpatient Medications on File Prior to Visit  Medication Sig Dispense Refill   diphenhydrAMINE (BENADRYL) 25 mg capsule Take 25 mg by mouth every 6 (six) hours as needed.     omeprazole (PRILOSEC) 40 MG capsule Take 40 mg by mouth every morning.     No current facility-administered medications on file prior to visit.    LABS/IMAGING: No results found for this or any previous visit (from the past 48 hour(s)). No results found.  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   Zachary Callahan has possible familial combined  hyperlipidemia with elevations in both his triglycerides and LDL cholesterol.  There is been substantial reduction in LDL with dietary changes but his triglycerides remain high and HDL remains low.  I do suspect this is genetic cholesterol disorder.  I offered genetic testing today however he declined.  He is interested however in and reducing his cholesterol further.  He agrees with guideline recommendations of 50% reduction in his LDL cholesterol.  That is best achieved by high intensity statin therapy.  Recommend  starting rosuvastatin 20 mg nightly.  We talked about possible side effects have encouraged him to reach out to me if there are issues.  In addition we will plan repeat liver enzymes in about 2 weeks after starting therapy as he has a history of elevated liver enzymes in the past.  Plan otherwise to repeat lipid testing in about 3 months and follow-up with me afterwards.  Thanks again for the kind referral.  Zachary Nose, MD, [facility]  [place]  [facility]  Medical Director of the Advanced Lipid Disorders &  Cardiovascular Risk Reduction Clinic Diplomate of the American Board of Clinical Lipidology Attending Cardiologist  Direct Dial: [phone]  Fax: [phone]  Website:  [name] 06/27/2023, 9:09 AM

## 2023-06-27 NOTE — Patient Instructions (Addendum)
Medication Instructions:  START rosuvastatin 20mg  daily  *If you need a refill on your cardiac medications before your next appointment, please call your pharmacy*   Lab Work: Non-Fasting liver function test in 2 weeks  FASTING lab work to check cholesterol in 3-4 months ** complete ONE WEEK before next visit  If you have labs (blood work) drawn today and your tests are completely normal, you will receive your results only by: MyChart Message (if you have MyChart) OR A paper copy in the mail If you have any lab test that is abnormal or we need to change your treatment, we will call you to review the results.   Follow-Up: At [facility], you and your health needs are our priority.  As part of our continuing mission to provide you with exceptional heart care, we have created designated Provider Care Teams.  These Care Teams include your primary Cardiologist (physician) and Advanced Practice Providers (APPs -  Physician Assistants and Nurse Practitioners) who all work together to provide you with the care you need, when you need it.  We recommend signing up for the patient portal called "MyChart".  Sign up information is provided on this After Visit Summary.  MyChart is used to connect with patients for Virtual Visits (Telemedicine).  Patients are able to view lab/test results, encounter notes, upcoming appointments, etc.  Non-urgent messages can be sent to your provider as well.   To learn more about what you can do with MyChart, go to [email or web address].    Your next appointment:   3-4 months with Dr. Rennis Golden for lipid clinic follow up ** OK to use a DOD appointment or double book on this day

## 2023-09-29 ENCOUNTER — Ambulatory Visit: Payer: BC Managed Care – PPO | Admitting: Internal Medicine

## 2023-10-20 DIAGNOSIS — E782 Mixed hyperlipidemia: Secondary | ICD-10-CM | POA: Diagnosis not present

## 2023-10-20 DIAGNOSIS — R748 Abnormal levels of other serum enzymes: Secondary | ICD-10-CM | POA: Diagnosis not present

## 2024-01-04 DIAGNOSIS — L0390 Cellulitis, unspecified: Secondary | ICD-10-CM | POA: Diagnosis not present

## 2024-01-04 DIAGNOSIS — L720 Epidermal cyst: Secondary | ICD-10-CM | POA: Diagnosis not present

## 2024-01-04 DIAGNOSIS — D485 Neoplasm of uncertain behavior of skin: Secondary | ICD-10-CM | POA: Diagnosis not present

## 2024-01-18 ENCOUNTER — Encounter (HOSPITAL_BASED_OUTPATIENT_CLINIC_OR_DEPARTMENT_OTHER): Payer: Self-pay | Admitting: *Deleted

## 2024-01-18 DIAGNOSIS — R748 Abnormal levels of other serum enzymes: Secondary | ICD-10-CM

## 2024-01-18 DIAGNOSIS — E7849 Other hyperlipidemia: Secondary | ICD-10-CM

## 2024-01-24 ENCOUNTER — Encounter (HOSPITAL_BASED_OUTPATIENT_CLINIC_OR_DEPARTMENT_OTHER): Payer: BC Managed Care – PPO | Admitting: Internal Medicine

## 2024-04-15 ENCOUNTER — Ambulatory Visit: Admitting: Internal Medicine

## 2024-05-06 ENCOUNTER — Ambulatory Visit: Attending: Internal Medicine | Admitting: Internal Medicine

## 2024-05-06 ENCOUNTER — Encounter: Payer: Self-pay | Admitting: Internal Medicine

## 2024-05-06 VITALS — BP 140/80 | HR 85 | Resp 16 | Ht 72.0 in | Wt 227.2 lb

## 2024-05-06 DIAGNOSIS — R748 Abnormal levels of other serum enzymes: Secondary | ICD-10-CM

## 2024-05-06 DIAGNOSIS — E782 Mixed hyperlipidemia: Secondary | ICD-10-CM

## 2024-05-06 NOTE — Progress Notes (Signed)
 LIPID CLINIC CONSULT NOTE  Chief Complaint:  Familial hyperlipidemia  Primary Care Physician: Patient, No Pcp Per  Primary Cardiologist:  None  HPI:  Zachary Callahan is a 33 y.o. male who is being seen today for the evaluation of familial hyperlipidemia at the request of No ref. provider found.  This a pleasant 33 year old male kindly referred for evaluation management of probable familial hyperlipidemia.  His lipids recently were significantly elevated with LDL 227, HDL 35 and triglycerides 204.  He was also noted to have mildly elevated liver enzymes with AST and ALT of 48-78.  He was using regular Tylenol  and he was advised to discontinue that.  Subsequently decided to work more aggressively with his diet over the past several months he has done that.  He again had repeat lipid testing.  This showed a total cholesterol 268, triglycerides 250, HDL 37 and LDL 183.  Overall substantial reduction in his cholesterol with changes including reducing saturated fats, fried foods, cutting out red meats and making other adjustments.  He has also lost some weight.  LP(a) was assessed as well which was 43 nmol/L, negative for increased risk.  He does not report early onset heart disease in the family.  He says his father did have high cholesterol but is on medications.  He has a sister who is vegan but does not report that she has any high cholesterol.  Despite the changes in his medications his LDL remains quite high and is concerning for a genetic dyslipidemia.  05/06/2024  Zachary Callahan is seen today in follow-up.  He is doing well on rosuvastatin .  He has had a marked reduction in his cholesterol.  He had lipids assessed in March showing total cholesterol of 152, triglycerides 164, HDL 38 and LDL 85.  He is tolerating rosuvastatin  without issues.  He has tried to make some dietary changes as well.  PMHx:  Past Medical History:  Diagnosis Date   Bleeding from the nose    GERD (gastroesophageal reflux  disease)    High cholesterol     Past Surgical History:  Procedure Laterality Date   NASAL ENDOSCOPY WITH EPISTAXIS CONTROL Left 03/15/2014   Procedure: NASAL ENDOSCOPY WITH EPISTAXIS CONTROL;  Surgeon: Norleen Notice, MD;  Location: [facility] OR;  Service: ENT;  Laterality: Left;    FAMHx:  Family History  Problem Relation Age of Onset   Hyperlipidemia Father     SOCHx:   reports that he has been smoking cigarettes. He has never used smokeless tobacco. He reports current alcohol use. He reports that he does not use drugs.  ALLERGIES:  No Known Allergies  ROS: Pertinent items noted in HPI and remainder of comprehensive ROS otherwise negative.  HOME MEDS: Current Outpatient Medications on File Prior to Visit  Medication Sig Dispense Refill   omeprazole (PRILOSEC) 40 MG capsule Take 40 mg by mouth every morning.     rosuvastatin  (CRESTOR ) 20 MG tablet Take 1 tablet (20 mg total) by mouth daily. 90 tablet 3   No current facility-administered medications on file prior to visit.    LABS/IMAGING: No results found for this or any previous visit (from the past 48 hours). No results found.  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   Zachary Callahan is doing well and tolerated rosuvastatin .  He has had marked improvement in his lipids.  LP(a) was negative.  His last ALT was 35 in March which is reasonable on statin therapy.  Statins actually may help reduce  the risk of worsening liver disease.  I did recommend continuing this indefinitely.  He can follow-up with his primary care provider for ongoing lipid checks and prescription of the rosuvastatin  and I am happy to see him back on an as-needed basis.  Vinie KYM Maxcy, MD, [facility], FNLA, FACP  [place]  [facility]  Medical Director of the [facility]  [facility] Diplomate of the American Board of Clinical Lipidology Attending Cardiologist  Direct Dial: [phone]  Fax: [phone]  Website:  [email or web address]   Vinie BROCKS Olanna Percifield 05/06/2024, 10:56 AM

## 2024-05-06 NOTE — Patient Instructions (Signed)
 Medication Instructions:  Continue same medications  Lab Work: None ordered  Testing/Procedures: None ordered  Follow-Up: At [facility], you and your health needs are our priority.  As part of our continuing mission to provide you with exceptional heart care, our providers are all part of one team.  This team includes your primary Cardiologist (physician) and Advanced Practice Providers or APPs (Physician Assistants and Nurse Practitioners) who all work together to provide you with the care you need, when you need it.  Your next appointment: As Needed    Provider:  Dr.Hilty  We recommend signing up for the patient portal called MyChart.  Sign up information is provided on this After Visit Summary.  MyChart is used to connect with patients for Virtual Visits (Telemedicine).  Patients are able to view lab/test results, encounter notes, upcoming appointments, etc.  Non-urgent messages can be sent to your provider as well.   To learn more about what you can do with MyChart, go to [email or web address].

## 2024-06-16 ENCOUNTER — Other Ambulatory Visit: Payer: Self-pay | Admitting: Internal Medicine

## 2024-06-18 DIAGNOSIS — F411 Generalized anxiety disorder: Secondary | ICD-10-CM | POA: Diagnosis not present

## 2024-06-25 DIAGNOSIS — F411 Generalized anxiety disorder: Secondary | ICD-10-CM | POA: Diagnosis not present

## 2024-07-09 DIAGNOSIS — F411 Generalized anxiety disorder: Secondary | ICD-10-CM | POA: Diagnosis not present
